# Patient Record
Sex: Female | Born: 1990 | Race: Black or African American | Hispanic: No | Marital: Single | Smoking: Never smoker
Health system: Southern US, Community
[De-identification: ages and names within clinical notes are randomized; demographics above are authoritative.]

## PROBLEM LIST (undated history)

## (undated) ENCOUNTER — Inpatient Hospital Stay (HOSPITAL_COMMUNITY): Payer: Self-pay

## (undated) ENCOUNTER — Emergency Department (HOSPITAL_BASED_OUTPATIENT_CLINIC_OR_DEPARTMENT_OTHER): Discharge status: Against Medical Advice | Payer: 59

## (undated) DIAGNOSIS — L0390 Cellulitis, unspecified: Secondary | ICD-10-CM

## (undated) DIAGNOSIS — Z8619 Personal history of other infectious and parasitic diseases: Secondary | ICD-10-CM

## (undated) DIAGNOSIS — L309 Dermatitis, unspecified: Secondary | ICD-10-CM

## (undated) DIAGNOSIS — J45909 Unspecified asthma, uncomplicated: Secondary | ICD-10-CM

## (undated) HISTORY — DX: Dermatitis, unspecified: L30.9

## (undated) HISTORY — DX: Unspecified asthma, uncomplicated: J45.909

## (undated) HISTORY — DX: Personal history of other infectious and parasitic diseases: Z86.19

## (undated) HISTORY — DX: Cellulitis, unspecified: L03.90

---
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
## 2012-08-20 ENCOUNTER — Other Ambulatory Visit: Payer: Self-pay | Admitting: Obstetrics and Gynecology

## 2012-08-20 DIAGNOSIS — N6311 Unspecified lump in the right breast, upper outer quadrant: Secondary | ICD-10-CM

## 2012-08-24 ENCOUNTER — Ambulatory Visit
Admission: RE | Admit: 2012-08-24 | Discharge: 2012-08-24 | Disposition: A | Discharge status: Home / Self Care | Payer: 59 | Source: Ambulatory Visit | Attending: Obstetrics and Gynecology | Admitting: Obstetrics and Gynecology

## 2012-08-24 DIAGNOSIS — N6311 Unspecified lump in the right breast, upper outer quadrant: Secondary | ICD-10-CM

## 2012-10-13 DIAGNOSIS — Z01419 Encounter for gynecological examination (general) (routine) without abnormal findings: Secondary | ICD-10-CM

## 2012-10-15 ENCOUNTER — Other Ambulatory Visit: Payer: Self-pay

## 2012-10-15 ENCOUNTER — Telehealth: Payer: Self-pay | Admitting: Obstetrics and Gynecology

## 2012-10-15 NOTE — Telephone Encounter (Signed)
Pt's mom cx appointment for Gardasil will call back to reschedule appointment at a later time

## 2012-11-22 ENCOUNTER — Telehealth: Payer: Self-pay | Admitting: Gynecology

## 2012-11-22 ENCOUNTER — Ambulatory Visit: Payer: Self-pay | Admitting: Obstetrics and Gynecology

## 2012-11-22 NOTE — Telephone Encounter (Signed)
Pt missed appt this afternoon and rescheduled to 11/26/12 at 3:15.  Chart in office.

## 2012-11-26 ENCOUNTER — Ambulatory Visit (INDEPENDENT_AMBULATORY_CARE_PROVIDER_SITE_OTHER): Payer: 59 | Admitting: Obstetrics and Gynecology

## 2012-11-26 ENCOUNTER — Encounter: Payer: Self-pay | Admitting: Obstetrics and Gynecology

## 2012-11-26 VITALS — BP 102/64 | HR 60 | Ht 67.0 in | Wt 135.0 lb

## 2012-11-26 DIAGNOSIS — N6320 Unspecified lump in the left breast, unspecified quadrant: Secondary | ICD-10-CM

## 2012-11-26 DIAGNOSIS — N6310 Unspecified lump in the right breast, unspecified quadrant: Secondary | ICD-10-CM

## 2012-11-26 DIAGNOSIS — N6019 Diffuse cystic mastopathy of unspecified breast: Secondary | ICD-10-CM

## 2012-11-26 DIAGNOSIS — N63 Unspecified lump in unspecified breast: Secondary | ICD-10-CM

## 2012-11-26 DIAGNOSIS — Z23 Encounter for immunization: Secondary | ICD-10-CM

## 2012-11-26 MED ORDER — ETONOGESTREL-ETHINYL ESTRADIOL 0.12-0.015 MG/24HR VA RING: VAGINAL_RING | VAGINAL | Status: DC

## 2012-11-26 NOTE — Progress Notes (Signed)
Patient ID: Felicia Klein, female   DOB: June 23, 1990, 22 y.o.   MRN: [id number]  Subjective  Here for breast recheck.  Patient does rechecks and does not feel an increase in size.    Was due for second Gardasil injection on 10/15/12.   Also wants to reinitiate contraception.  Stooped OCPS because she was forgetting to take OCPs and had irregular bleeding.  Using condoms currently.     Objective  Right breast with bilobed masses at 2 o'clock - 2.0 cm total area.  Left breat with 1 cm mass at 3 o'clock  (states has been previously identified and evaluated.)  No retractions, palpable nodes, or axillary adenopathy noted.    US Breast Right Status: Final result            Study Result    Clinical Data: RIGHT BREAST LUMP 72:20; 74-YEAR-OLD PATIENT WITH  PALPABLE LUMP FELT AT 10 O'CLOCK POSITION ON RECENT CLINICAL  PHYSICAL EXAMINATION. THE PATIENT BELIEVES THAT THE MASS HAS BEEN  UNCHANGED FOR APPROXIMATELY 7 YEARS. THE PATIENT'S MOTHER HAS A  HISTORY OF A FIBROADENOMA, EXCISED MANY YEARS AGO.  BREAST ULTRASOUND RIGHT  Comparison: None  On physical exam, there is a discretely palpable approximately 2 cm  mobile mass in the superficial aspect of the nipple.  Ultrasound is performed, showing a 2.1 x 1.7 x 0.9circumscribed  oval hypoechoic mass oriented parallel to the chest wall,  containing internal echogenic bands.  Immediately adjacent and medial to this palpable mass is a second  similar-appearing mass that is not discretely palpable, at 10:30  position 10 cm from the nipple. This second mass measures 2.6 x 0.6  x 2.2 cm, is circumscribed and oriented parallel to the chest wall.  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 The fibroadenoma at 10 o'clock position is discretely  palpable. Ultrasound follow-up at 6, 12, and 24 months is  suggested. The option of surgical excision is also discussed with  the patient and her mother.  The patient prefers ultrasound follow-  up.  Right breast ultrasound in 6 months is recommended.  BI-RADS CATEGORY 3: Probably benign finding(s) - short interval  follow-up suggested.  Original Report Authenticated By: Britta Mccreedy, M.D.       Assessment  Bilateral breast lumps.  Previous ultrasound of the right notes probably fibroadenomas.  New lump of the left breast. Need for Gardasil catch up vaccine. Need for contraception.  Plan  Bilateral breast ultrasound in September at [facility].  Appointment scheduled today. Gardasil vaccine number 2. NuvaRing Rx for 9 months.  Patient instructed in use, risks, and benefits.

## 2012-11-26 NOTE — Patient Instructions (Signed)

## 2012-11-27 DIAGNOSIS — N6019 Diffuse cystic mastopathy of unspecified breast: Secondary | ICD-10-CM | POA: Insufficient documentation

## 2013-02-25 ENCOUNTER — Ambulatory Visit
Admission: RE | Admit: 2013-02-25 | Discharge: 2013-02-25 | Disposition: A | Discharge status: Home / Self Care | Payer: 59 | Source: Ambulatory Visit | Attending: Obstetrics and Gynecology | Admitting: Obstetrics and Gynecology

## 2013-02-25 DIAGNOSIS — N6320 Unspecified lump in the left breast, unspecified quadrant: Secondary | ICD-10-CM

## 2013-02-25 DIAGNOSIS — N6310 Unspecified lump in the right breast, unspecified quadrant: Secondary | ICD-10-CM

## 2013-02-26 ENCOUNTER — Ambulatory Visit: Payer: 59

## 2013-03-11 ENCOUNTER — Ambulatory Visit: Payer: 59

## 2013-03-14 ENCOUNTER — Ambulatory Visit (INDEPENDENT_AMBULATORY_CARE_PROVIDER_SITE_OTHER): Payer: 59 | Admitting: *Deleted

## 2013-03-14 VITALS — BP 110/60 | HR 82 | Resp 20 | Wt 132.0 lb

## 2013-03-14 DIAGNOSIS — Z23 Encounter for immunization: Secondary | ICD-10-CM

## 2013-04-11 ENCOUNTER — Other Ambulatory Visit: Payer: Self-pay

## 2013-05-27 ENCOUNTER — Telehealth: Payer: Self-pay | Admitting: Obstetrics and Gynecology

## 2013-05-27 NOTE — Telephone Encounter (Signed)
Spoke with pt whose LMP was 03-21-13. Pt reports she had a + UPT at the end of November or beginning of December. Sched OV tomorrow with BS at 1 pm.

## 2013-05-27 NOTE — Telephone Encounter (Signed)
Patient would like an appointment to confirm pregnancy. Patient would like an appointment tomorrow 05/28/13.

## 2013-05-28 ENCOUNTER — Other Ambulatory Visit: Payer: Self-pay | Admitting: Obstetrics and Gynecology

## 2013-05-28 ENCOUNTER — Encounter: Payer: Self-pay | Admitting: Obstetrics and Gynecology

## 2013-05-28 ENCOUNTER — Ambulatory Visit (INDEPENDENT_AMBULATORY_CARE_PROVIDER_SITE_OTHER): Payer: 59 | Admitting: Obstetrics and Gynecology

## 2013-05-28 ENCOUNTER — Ambulatory Visit (INDEPENDENT_AMBULATORY_CARE_PROVIDER_SITE_OTHER): Payer: 59

## 2013-05-28 VITALS — BP 120/62 | HR 84 | Resp 16 | Ht 68.5 in | Wt 134.0 lb

## 2013-05-28 DIAGNOSIS — Z124 Encounter for screening for malignant neoplasm of cervix: Secondary | ICD-10-CM

## 2013-05-28 DIAGNOSIS — Z3401 Encounter for supervision of normal first pregnancy, first trimester: Secondary | ICD-10-CM

## 2013-05-28 DIAGNOSIS — Z3400 Encounter for supervision of normal first pregnancy, unspecified trimester: Secondary | ICD-10-CM

## 2013-05-28 DIAGNOSIS — Z113 Encounter for screening for infections with a predominantly sexual mode of transmission: Secondary | ICD-10-CM

## 2013-05-28 DIAGNOSIS — N912 Amenorrhea, unspecified: Secondary | ICD-10-CM

## 2013-05-28 LAB — US OB COMP LESS 14 WKS

## 2013-05-28 NOTE — Progress Notes (Signed)
Patient ID: Felicia Klein, female   DOB: [birth year], 22 y.o.   MRN: [id number] GYNECOLOGY PROBLEM VISIT  PCP:   None  Referring provider:   HPI: 22 y.o.   Single  African American  female   G1P0 with Patient's last menstrual period was 03/21/2013.   here for  Confirmation of pregnancy.  Stopped using NuvaRing and switched to condoms.  Had spotting two weeks ago. This was spontaneous and very small amount.  Breast tenderness, fatigue, a little more short of breath. No nausea. Maybe gained a few pounds. Some bloating symptoms. Some increase in vaginal discharge but no odor or itching.   Taking PNV.    GYNECOLOGIC HISTORY: Patient's last menstrual period was 03/21/2013. Sexually active:  yes Partner preference: female Contraception: condoms occ. Menopausal hormone therapy: n/a DES exposure:   no Blood transfusions:   no Sexually transmitted diseases:  Treated for Chlamydia and Gonorrhea 2011  GYN Procedures:  no Mammogram:   n/a            Pap:   never History of abnormal pap smear:  no   OB History   Grav Para Term Preterm Abortions TAB SAB Ect Mult Living   1                  Family History  Problem Relation Age of Onset  . Diabetes Father   . Diabetes Maternal Grandmother   . Cancer Maternal Grandmother     peritoneal cancer-dec age 75  . Hypertension Maternal Grandmother   . Diabetes Paternal Grandmother   . Thyroid disease Paternal Grandmother     Patient Active Problem List   Diagnosis Date Noted  . Fibrocystic breast disease 11/27/2012    Past Medical History  Diagnosis Date  . Eczema   . Cellulitis     legs    History reviewed. No pertinent past surgical history.  ALLERGIES: Review of patient's allergies indicates no known allergies.  No current outpatient prescriptions on file.   No current facility-administered medications for this visit.     ROS:  Pertinent items are noted in HPI.  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  PHYSICAL EXAMINATION:    BP 120/62  Pulse 84  Resp 16  Ht 5' 8.5" (1.74 m)  Wt 134 lb (60.782 kg)  BMI 20.08 kg/m2  LMP 03/21/2013   Wt Readings from Last 3 Encounters:  05/28/13 134 lb (60.782 kg)  03/14/13 132 lb (59.875 kg)  11/26/12 135 lb (61.236 kg)     Ht Readings from Last 3 Encounters:  05/28/13 5' 8.5" (1.74 m)  11/26/12 5\' 7"  (1.702 m)    General appearance: alert, cooperative and appears stated age Head: Normocephalic, without obvious abnormality, atraumatic Neck: no adenopathy, supple, symmetrical, trachea midline and thyroid not enlarged, symmetric, no tenderness/mass/nodules Lungs: clear to auscultation bilaterally Breasts: Inspection negative, No nipple retraction or dimpling, No nipple discharge or bleeding, No axillary or supraclavicular adenopathy, bilobed mass of the right breast at the 10 - 11 o'clock position.  1 cm and 0.5 cm, each nontender. Heart: regular rate and rhythm Abdomen: soft, non-tender; no masses,  no organomegaly Extremities: extremities normal, atraumatic, no cyanosis or edema Skin: Skin color, texture, turgor normal. No rashes or lesions Lymph nodes: Cervical, supraclavicular, and axillary nodes normal. No abnormal inguinal nodes palpated Neurologic: Grossly normal  Pelvic: External genitalia:  no lesions              Urethra:  normal appearing urethra with no masses, tenderness or lesions              Bartholins and Skenes: normal                 Vagina: normal appearing vagina with normal color and discharge, no lesions              Cervix: normal appearance              Pap and high risk HPV testing done: yes and GC/CT.            Bimanual Exam:  Uterus:  uterus is 10 week size and nontender.                                       Adnexa: normal adnexa in size, nontender and no masses                                      Rectovaginal: Confirms                                      Anus:  normal sphincter tone, no  lesions  ASSESSMENT  Early pregnancy. Right breast fibroadenomas.  Stable on physical exam.  Status post Gardasil #2 ion 03/14/13. History of GC/CT in past.   PLAN  Counseled on early prenatal care. Switch to PNV wit [facility]. Pap and GC/CT Discussed Gardasil exposure and early pregnancy using Up to Date data bank.  No adverse effect likely. Patient [facility]  She will follow through with this.  Return for ultrasound to document EGA and viability of fetus.    An After Visit Summary was printed and given to the patient.

## 2013-05-28 NOTE — Patient Instructions (Signed)
Prenatal Care  WHAT IS PRENATAL CARE?  Prenatal care means health care during your pregnancy, before your baby is born. Take care of yourself and your baby by:   Getting early prenatal care. If you know you are pregnant, or think you might be pregnant, call your caregiver as soon as possible. Schedule a visit for a general/prenatal examination.  Getting regular prenatal care. Follow your caregiver's schedule for blood and other necessary tests. Do not miss appointments.  Do everything you can to keep yourself and your baby healthy during your pregnancy.  Prenatal care should include evaluation of medical, dietary, educational, psychological, and social needs for the couple and the medical, surgical, and genetic history of the family of the mother and father.  Discuss with your caregiver:  Your medicines, prescription, over-the-counter, and herbal medicines.  Substance abuse, alcohol, smoking, and illegal drugs.  Domestic abuse and violence, if present.  Your immunizations.  Nutrition and diet.  Exercising.  Environment and occupational hazards, at home and at work.  History of sexually transmitted disease, both you and your partner.  Previous pregnancies. WHY IS PRENATAL CARE SO IMPORTANT?  By seeing you regularly, your caregiver has the chance to find problems early, so that they can be treated as soon as possible. Other problems might be prevented. Many studies have shown that early and regular prenatal care is important for the health of both mothers and their babies.  I AM THINKING ABOUT GETTING PREGNANT. HOW CAN I TAKE CARE OF MYSELF?  Taking care of yourself before you get pregnant helps you to have a healthy pregnancy. It also lowers your chances of having a baby born with a birth defect. Here are ways to take care of yourself before you get pregnant:   Eat healthy foods, exercise regularly (30 minutes per day for most days of the week is best), and get enough rest and  sleep. Talk to your caregiver about what kinds of foods and exercises are best for you.  Take 400 micrograms (mcg) of folic acid (one of the B vitamins) every day. The best way to do this is to take a daily multivitamin pill that contains this amount of folic acid. Getting enough of the synthetic (manufactured) form of folic acid every day before you get pregnant and during early pregnancy can help prevent certain birth defects. Many breakfast cereals and other grain products have folic acid added to them, but only certain cereals contain 400 mcg of folic acid per serving. Check the label on your multivitamin or cereal to find the amount of folic acid in the food.  See your caregiver for a complete check up before getting pregnant. Make sure that you have had all your immunization shots, especially for rubella (German measles). Rubella can cause serious birth defects. Chickenpox is another illness you want to avoid during pregnancy. If you have had chickenpox and rubella in the past, you should be immune to them.  Tell your caregiver about any prescription or non-prescription medicines (including herbal remedies) you are taking. Some medicines are not safe to take during pregnancy.  Stop smoking cigarettes, drinking alcohol, or taking illegal drugs. Ask your caregiver for help, if you need it. You can also get help with alcohol and drugs by talking with a member of your faith community, a counselor, or a trusted friend.  Discuss and treat any medical, social, or psychological problems before getting pregnant.  Discuss any history of genetic problems in the mother, father, and their families. Do   genetic testing before getting pregnant, when possible.  Discuss any physical or emotional abuse with your caregiver.  Discuss with your caregiver if you might be exposed to harmful chemicals on your job or where you live.  Discuss with your caregiver if you think your job or the hours you work may be  harmful and should be changed.  The father should be involved with the decision making and with all aspects of the pregnancy, labor, and delivery.  If you have medical insurance, make sure you are covered for pregnancy. I JUST FOUND OUT THAT I AM PREGNANT. HOW CAN I TAKE CARE OF MYSELF?  Here are ways to take care of yourself and the precious new life growing inside you:   Continue taking your multivitamin with 400 micrograms (mcg) of folic acid every day.  Get early and regular prenatal care. It does not matter if this is your first pregnancy or if you already have children. It is very important to see a caregiver during your pregnancy. Your caregiver will check at each visit to make sure that you and the baby are healthy. If there are any problems, action can be taken right away to help you and the baby.  Eat a healthy diet that includes:  Fruits.  Vegetables.  Foods low in saturated fat.  Grains.  Calcium-rich foods.  Drink 6 to 8 glasses of liquids a day.  Unless your caregiver tells you not to, try to be physically active for 30 minutes, most days of the week. If you are pressed for time, you can get your activity in through 10 minute segments, three times a day.  If you smoke, drink alcohol, or use drugs, STOP. These can cause long-term damage to your baby. Talk with your caregiver about steps to take to stop smoking. Talk with a member of your faith community, a counselor, a trusted friend, or your caregiver if you are concerned about your alcohol or drug use.  Ask your caregiver before taking any medicine, even over-the-counter medicines. Some medicines are not safe to take during pregnancy.  Get plenty of rest and sleep.  Avoid hot tubs and saunas during pregnancy.  Do not have X-rays taken, unless absolutely necessary and with the recommendation of your caregiver. A lead shield can be placed on your abdomen, to protect the baby when X-rays are taken in other parts of the  body.  Do not empty the cat litter when you are pregnant. It may contain a parasite that causes an infection called toxoplasmosis, which can cause birth defects. Also, use gloves when working in garden areas used by cats.  Do not eat uncooked or undercooked cheese, meats, or fish.  Stay away from toxic chemicals like:  Insecticides.  Solvents (some cleaners or paint thinners).  Lead.  Mercury.  Sexual relations may continue until the end of the pregnancy, unless you have a medical problem or there is a problem with the pregnancy and your caregiver tells you not to.  Do not wear high heel shoes, especially during the second half of the pregnancy. You can lose your balance and fall.  Do not take long trips, unless absolutely necessary. Be sure to see your caregiver before going on the trip.  Do not sit in one position for more than 2 hours, when on a trip.  Take a copy of your medical records when going on a trip.  Know where there is a hospital in the city you are visiting, in case of an   emergency.  Most dangerous household products will have pregnancy warnings on their labels. Ask your caregiver about products if you are unsure.  Limit or eliminate your caffeine intake from coffee, tea, sodas, medicines, and chocolate.  Many women continue working through pregnancy. Staying active might help you stay healthier. If you have a question about the safety or the hours you work at your particular job, talk with your caregiver.  Get informed:  Read books.  Watch videos.  Go to childbirth classes for you and the father.  Talk with experienced moms.  Ask your caregiver about childbirth education classes for you and your partner. Classes can help you and your partner prepare for the birth of your baby.  Ask about a pediatrician (baby doctor) and methods and pain medicine for labor, delivery, and possible Cesarean delivery (C-section). I AM NOT THINKING ABOUT GETTING PREGNANT  RIGHT NOW, BUT HEARD THAT ALL WOMEN SHOULD TAKE FOLIC ACID EVERY DAY?  All women of childbearing age, with even a remote chance of getting pregnant, should try to make sure they get enough folic acid. Many pregnancies are not planned. Many women do not know they are actually pregnant early in their pregnancies, and certain birth defects happen in the very early part of pregnancy. Taking 400 micrograms (mcg) of folic acid every day will help prevent certain birth defects that happen in the early part of pregnancy. If a woman begins taking vitamin pills in the second or third month of pregnancy, it may be too late to prevent birth defects. Folic acid may also have other health benefits for women, besides preventing birth defects.  HOW OFTEN SHOULD I SEE MY CAREGIVER DURING PREGNANCY?  Your caregiver will give you a schedule for your prenatal visits. You will have visits more often as you get closer to the end of your pregnancy. An average pregnancy lasts about 40 weeks.  A typical schedule includes visiting your caregiver:   About once each month, during your first 6 months of pregnancy.  Every 2 weeks, during the next 2 months.  Weekly in the last month, until the delivery date. Your caregiver will probably want to see you more often if:  You are over 35.  Your pregnancy is high risk, because you have certain health problems or problems with the pregnancy, such as:  Diabetes.  High blood pressure.  The baby is not growing on schedule, according to the dates of the pregnancy. Your caregiver will do special tests, to make sure you and the baby are not having any serious problems. WHAT HAPPENS DURING PRENATAL VISITS?   At your first prenatal visit, your caregiver will talk to you about you and your partner's health history and your family's health history, and will do a physical exam.  On your first visit, a physical exam will include checks of your blood pressure, height and weight, and an  exam of your pelvic organs. Your caregiver will do a Pap test if you have not had one recently, and will do cultures of your cervix to make sure there is no infection.  At each visit, there will be tests of your blood, urine, blood pressure, weight, and checking the progress of the baby.  Your caregiver will be able to tell you when to expect that your baby will be born.  Each visit is also a chance for you to learn about staying healthy during pregnancy and for asking questions.  Discuss whether you will be breastfeeding.  At your later prenatal   visits, your caregiver will check how you are doing and how the baby is developing. You may have a number of tests done as your pregnancy progresses.  Ultrasound exams are often used to check on the baby's growth and health.  You may have more urine and blood tests, as well as special tests, if needed. These may include amniocentesis (examine fluid in the pregnancy sac), stress tests (check how baby responds to contractions), biophysical profile (measures fetus well-being). Your caregiver will explain the tests and why they are necessary. I AM IN MY LATE THIRTIES, AND I WANT TO HAVE A CHILD NOW. SHOULD I DO ANYTHING SPECIAL?  As you get older, there is more chance of having a medical problem (high blood pressure), pregnancy problem (preeclampsia, problems with the placenta), miscarriage, or a baby born with a birth defect. However, most women in their late thirties and early forties have healthy babies. See your caregiver on a regular basis before you get pregnant and be sure to go for exams throughout your pregnancy. Your caregiver probably will want to do some special tests to check on you and your baby's health when you are pregnant.  Women today are often delaying having children until later in life, when they are in their thirties and forties. While many women in their thirties and forties have no difficulty getting pregnant, fertility does decline  with age. For women over 40 who cannot get pregnant after 6 months of trying, it is recommended that they see their caregiver for a fertility evaluation. It is not uncommon to have trouble becoming pregnant or experience infertility (inability to become pregnant after trying for one year). If you think that you or your partner may be infertile, you can discuss this with your caregiver. He or she can recommend treatments such as drugs, surgery, or assisted reproductive technology.  Document Released: 05/26/2003 Document Revised: 08/15/2011 Document Reviewed: 11/08/2012 [employer] Patient Information 2014 [employer].  

## 2013-05-28 NOTE — Progress Notes (Signed)
Ultrasound findings reviewed -  Viable IUP, EGA 9 + 5 weeks, FH 160, left CL cyst.     After visit summary to patient.  35 minutes face to face time of which over 50% was spent in counseling.

## 2013-05-29 LAB — GC/CHLAMYDIA PROBE AMP, URINE
Chlamydia, Swab/Urine, PCR: NEGATIVE
GC Probe Amp, Urine: NEGATIVE

## 2013-06-06 NOTE — L&D Delivery Note (Signed)
Operative Delivery Note At 2:58 AM a viable and healthy female was delivered via Vaginal, Spontaneous Delivery.  Presentation: vertex; Position: Left,, Occiput,, Anterior; Station: +5.  Delivery of the head: 12/25/2013  2:57 AM  , [place]  Second maneuver: , Suprapubic Pressure Posterior arm delivered  APGAR: 7, 9; weight 8 lb 2.7 oz (3705 g).   Placenta status: Intact, Spontaneous.   Cord: 3 vessels   Anesthesia: Epidural  Episiotomy: None Lacerations: 1st degree not requiring repair Suture Repair: NA Est. Blood Loss (mL): 300  Mom to postpartum.  Baby to Couplet care / Skin to Skin.  Felicia Klein H. 12/25/2013, 3:19 AM

## 2013-06-28 ENCOUNTER — Other Ambulatory Visit: Payer: Self-pay

## 2013-06-28 LAB — OB RESULTS CONSOLE ABO/RH: RH TYPE: POSITIVE

## 2013-06-28 LAB — OB RESULTS CONSOLE ANTIBODY SCREEN: Antibody Screen: NEGATIVE

## 2013-06-28 LAB — OB RESULTS CONSOLE HIV ANTIBODY (ROUTINE TESTING): HIV: NONREACTIVE

## 2013-06-28 LAB — OB RESULTS CONSOLE GC/CHLAMYDIA
Chlamydia: NEGATIVE
Gonorrhea: NEGATIVE

## 2013-06-28 LAB — OB RESULTS CONSOLE HEPATITIS B SURFACE ANTIGEN: Hepatitis B Surface Ag: NEGATIVE

## 2013-06-28 LAB — OB RESULTS CONSOLE RUBELLA ANTIBODY, IGM: Rubella: IMMUNE

## 2013-06-28 LAB — OB RESULTS CONSOLE RPR: RPR: NONREACTIVE

## 2013-12-05 LAB — OB RESULTS CONSOLE GBS: GBS: NEGATIVE

## 2013-12-12 ENCOUNTER — Inpatient Hospital Stay (HOSPITAL_COMMUNITY)
Admission: AD | Admit: 2013-12-12 | Discharge: 2013-12-12 | Disposition: A | Discharge status: Home / Self Care | Payer: 59 | Source: Ambulatory Visit | Attending: Obstetrics and Gynecology | Admitting: Obstetrics and Gynecology

## 2013-12-12 ENCOUNTER — Encounter (HOSPITAL_COMMUNITY): Payer: Self-pay | Admitting: *Deleted

## 2013-12-12 DIAGNOSIS — Z833 Family history of diabetes mellitus: Secondary | ICD-10-CM | POA: Insufficient documentation

## 2013-12-12 DIAGNOSIS — O99891 Other specified diseases and conditions complicating pregnancy: Secondary | ICD-10-CM | POA: Diagnosis not present

## 2013-12-12 DIAGNOSIS — R030 Elevated blood-pressure reading, without diagnosis of hypertension: Secondary | ICD-10-CM | POA: Insufficient documentation

## 2013-12-12 DIAGNOSIS — O26839 Pregnancy related renal disease, unspecified trimester: Secondary | ICD-10-CM | POA: Insufficient documentation

## 2013-12-12 DIAGNOSIS — O9989 Other specified diseases and conditions complicating pregnancy, childbirth and the puerperium: Principal | ICD-10-CM

## 2013-12-12 DIAGNOSIS — O1213 Gestational proteinuria, third trimester: Secondary | ICD-10-CM

## 2013-12-12 LAB — CBC
HCT: 32.9 % — ABNORMAL LOW (ref 36.0–46.0)
Hemoglobin: 11.1 g/dL — ABNORMAL LOW (ref 12.0–15.0)
MCH: 30.5 pg (ref 26.0–34.0)
MCHC: 33.7 g/dL (ref 30.0–36.0)
MCV: 90.4 fL (ref 78.0–100.0)
PLATELETS: 156 10*3/uL (ref 150–400)
RBC: 3.64 MIL/uL — ABNORMAL LOW (ref 3.87–5.11)
RDW: 13.4 % (ref 11.5–15.5)
WBC: 11.3 10*3/uL — ABNORMAL HIGH (ref 4.0–10.5)

## 2013-12-12 LAB — COMPREHENSIVE METABOLIC PANEL
ALBUMIN: 3.1 g/dL — AB (ref 3.5–5.2)
ALT: 9 U/L (ref 0–35)
AST: 20 U/L (ref 0–37)
Alkaline Phosphatase: 114 U/L (ref 39–117)
Anion gap: 11 (ref 5–15)
BUN: 8 mg/dL (ref 6–23)
CALCIUM: 9 mg/dL (ref 8.4–10.5)
CO2: 24 meq/L (ref 19–32)
CREATININE: 0.71 mg/dL (ref 0.50–1.10)
Chloride: 102 mEq/L (ref 96–112)
GFR calc Af Amer: >90 mL/min (ref >90)
Glucose, Bld: 71 mg/dL (ref 70–99)
Potassium: 4 mEq/L (ref 3.7–5.3)
Sodium: 137 mEq/L (ref 137–147)
TOTAL PROTEIN: 6.7 g/dL (ref 6.0–8.3)
Total Bilirubin: 0.3 mg/dL (ref 0.3–1.2)

## 2013-12-12 LAB — URINALYSIS, ROUTINE W REFLEX MICROSCOPIC
Bilirubin Urine: NEGATIVE
GLUCOSE, UA: NEGATIVE mg/dL
KETONES UR: NEGATIVE mg/dL
Nitrite: NEGATIVE
PROTEIN: 100 mg/dL — AB
Specific Gravity, Urine: 1.02 (ref 1.005–1.030)
Urobilinogen, UA: 0.2 mg/dL (ref 0.0–1.0)
pH: 6.5 (ref 5.0–8.0)

## 2013-12-12 LAB — URINE MICROSCOPIC-ADD ON

## 2013-12-12 LAB — PROTEIN / CREATININE RATIO, URINE
CREATININE, URINE: 178.16 mg/dL
Protein Creatinine Ratio: 0.47 — ABNORMAL HIGH (ref 0.00–0.15)
TOTAL PROTEIN, URINE: 83.3 mg/dL

## 2013-12-12 LAB — LACTATE DEHYDROGENASE: LDH: 283 U/L — AB (ref 94–250)

## 2013-12-12 LAB — URIC ACID: Uric Acid, Serum: 5 mg/dL (ref 2.4–7.0)

## 2013-12-12 NOTE — MAU Note (Signed)
Urine sent to lab 

## 2013-12-12 NOTE — MAU Provider Note (Signed)
History     CSN: [id number]  Arrival date and time: 12/12/13 1456   First Provider Initiated Contact with Patient 12/12/13 1527      Chief Complaint  Patient presents with  . Hypertension   HPI  Pt is [redacted] weeks pregnant G1P0 seen in the office today with elevated BP and 1+proteinuria. Pt is here for [facility] evaluation and NST. Pt denies LOF or bleeding.  Pt's cervix was 2 cm in the office. Pt denies N/V, constipation or diarrhea, UTI sx. Pt denies abd pain or epigastric pain. Pt has some edema in feet when gets up in the mornings.  Pt denies c/o  Past Medical History  Diagnosis Date  . Eczema   . Cellulitis     legs    History reviewed. No pertinent past surgical history.  Family History  Problem Relation Age of Onset  . Diabetes Father   . Diabetes Maternal Grandmother   . Cancer Maternal Grandmother     peritoneal cancer-dec age 110  . Hypertension Maternal Grandmother   . Diabetes Paternal Grandmother   . Thyroid disease Paternal Grandmother     History  Substance Use Topics  . Smoking status: Never Smoker   . Smokeless tobacco: Never Used  . Alcohol Use: No    Allergies: No Known Allergies  Prescriptions prior to admission  Medication Sig Dispense Refill  . metroNIDAZOLE (FLAGYL) 500 MG tablet Take 500 mg by mouth 2 (two) times daily.      . Prenatal Vit-Fe Fumarate-FA (PRENATAL MULTIVITAMIN) TABS tablet Take 1 tablet by mouth daily at 12 noon.        Review of Systems  Constitutional: Negative for fever and chills.  Gastrointestinal: Negative for nausea, vomiting, abdominal pain, diarrhea and constipation.  Genitourinary: Negative for dysuria and urgency.  Neurological: Negative for headaches.   Physical Exam   There were no vitals taken for this visit.  Physical Exam  Nursing note and vitals reviewed. Constitutional: She is oriented to person, place, and time. She appears well-developed and well-nourished. No distress.  HENT:  Head: Normocephalic.   Eyes: Pupils are equal, round, and reactive to light.  Neck: Normal range of motion. Neck supple.  Respiratory: Effort normal.  GI: Soft. She exhibits no distension. There is no tenderness. There is no rebound and no guarding.  Irregular ctx; FHR/NST reactive, no decelerations  Musculoskeletal: Normal range of motion.  Neurological: She is alert and oriented to person, place, and time.  Skin: Skin is warm and dry.  Psychiatric: She has a normal mood and affect.    MAU Course  Procedures Results for orders placed during the hospital encounter of 12/12/13 (from the past 24 hour(s))  PROTEIN / CREATININE RATIO, URINE     Status: Abnormal   Collection Time    12/12/13  3:37 PM      Result Value Ref Range   Creatinine, Urine 178.16     Total Protein, Urine 83.3     PROTEIN CREATININE RATIO 0.47 (*) 0.00 - 0.15  URINALYSIS, ROUTINE W REFLEX MICROSCOPIC     Status: Abnormal   Collection Time    12/12/13  3:37 PM      Result Value Ref Range   Color, Urine YELLOW  YELLOW   APPearance CLEAR  CLEAR   Specific Gravity, Urine 1.020  1.005 - 1.030   pH 6.5  5.0 - 8.0   Glucose, UA NEGATIVE  NEGATIVE mg/dL   Hgb urine dipstick SMALL (*) NEGATIVE   Bilirubin  Urine NEGATIVE  NEGATIVE   Ketones, ur NEGATIVE  NEGATIVE mg/dL   Protein, ur 161100 (*) NEGATIVE mg/dL   Urobilinogen, UA 0.2  0.0 - 1.0 mg/dL   Nitrite NEGATIVE  NEGATIVE   Leukocytes, UA SMALL (*) NEGATIVE  URINE MICROSCOPIC-ADD ON     Status: Abnormal   Collection Time    12/12/13  3:37 PM      Result Value Ref Range   Squamous Epithelial / LPF MANY (*) RARE   WBC, UA 11-20  <3 WBC/hpf   RBC / HPF 0-2  <3 RBC/hpf   Bacteria, UA RARE  RARE   Urine-Other MUCOUS PRESENT    CBC     Status: Abnormal   Collection Time    12/12/13  3:45 PM      Result Value Ref Range   WBC 11.3 (*) 4.0 - 10.5 K/uL   RBC 3.64 (*) 3.87 - 5.11 MIL/uL   Hemoglobin 11.1 (*) 12.0 - 15.0 g/dL   HCT 09.632.9 (*) 04.536.0 - 40.946.0 %   MCV 90.4  78.0 - 100.0 fL    MCH 30.5  26.0 - 34.0 pg   MCHC 33.7  30.0 - 36.0 g/dL   RDW 81.113.4  91.411.5 - 78.215.5 %   Platelets 156  150 - 400 K/uL  COMPREHENSIVE METABOLIC PANEL     Status: Abnormal   Collection Time    12/12/13  3:45 PM      Result Value Ref Range   Sodium 137  137 - 147 mEq/L   Potassium 4.0  3.7 - 5.3 mEq/L   Chloride 102  96 - 112 mEq/L   CO2 24  19 - 32 mEq/L   Glucose, Bld 71  70 - 99 mg/dL   BUN 8  6 - 23 mg/dL   Creatinine, Ser 9.560.71  0.50 - 1.10 mg/dL   Calcium 9.0  8.4 - 21.310.5 mg/dL   Total Protein 6.7  6.0 - 8.3 g/dL   Albumin 3.1 (*) 3.5 - 5.2 g/dL   AST 20  0 - 37 U/L   ALT 9  0 - 35 U/L   Alkaline Phosphatase 114  39 - 117 U/L   Total Bilirubin 0.3  0.3 - 1.2 mg/dL   GFR calc non Af Amer >90  >90 mL/min   GFR calc Af Amer >90  >90 mL/min   Anion gap 11  5 - 15  LACTATE DEHYDROGENASE     Status: Abnormal   Collection Time    12/12/13  3:45 PM      Result Value Ref Range   LDH 283 (*) 94 - 250 U/L  URIC ACID     Status: None   Collection Time    12/12/13  3:45 PM      Result Value Ref Range   Uric Acid, Serum 5.0  2.4 - 7.0 mg/dL  NST- reactive Urine culture pending Discussed with Dr. Dareen PianoAnderson; will send pt home with 24 hr urine collection for protein and creatinine  Assessment and Plan  Proteinuria- 24 hour urine for protein and creatinine Pt to return tomorrow to MAU to return collected urine and BP check  Brewer Hitchman 12/12/2013, 3:30 PM

## 2013-12-12 NOTE — MAU Note (Signed)
Pt was sent over from the office for further evaluation of her bp

## 2013-12-13 ENCOUNTER — Encounter (HOSPITAL_COMMUNITY): Payer: Self-pay | Admitting: *Deleted

## 2013-12-13 ENCOUNTER — Inpatient Hospital Stay (HOSPITAL_COMMUNITY)
Admission: AD | Admit: 2013-12-13 | Discharge: 2013-12-13 | Disposition: A | Discharge status: Home / Self Care | Payer: 59 | Source: Ambulatory Visit | Attending: Obstetrics & Gynecology | Admitting: Obstetrics & Gynecology

## 2013-12-13 DIAGNOSIS — O133 Gestational [pregnancy-induced] hypertension without significant proteinuria, third trimester: Secondary | ICD-10-CM

## 2013-12-13 DIAGNOSIS — O99891 Other specified diseases and conditions complicating pregnancy: Secondary | ICD-10-CM | POA: Diagnosis present

## 2013-12-13 DIAGNOSIS — Z833 Family history of diabetes mellitus: Secondary | ICD-10-CM | POA: Insufficient documentation

## 2013-12-13 DIAGNOSIS — O139 Gestational [pregnancy-induced] hypertension without significant proteinuria, unspecified trimester: Secondary | ICD-10-CM | POA: Insufficient documentation

## 2013-12-13 LAB — CREATININE CLEARANCE, URINE, 24 HOUR
CREATININE 24H UR: 1401 mg/d (ref 700–1800)
CREATININE: 0.71 mg/dL (ref 0.50–1.10)
Collection Interval-CRCL: 24 hours
Creatinine Clearance: 137 mL/min — ABNORMAL HIGH (ref 75–115)
Creatinine, Urine: 100.1 mg/dL
Urine Total Volume-CRCL: 1400 mL

## 2013-12-13 LAB — CULTURE, OB URINE
COLONY COUNT: NO GROWTH
CULTURE: NO GROWTH
Special Requests: NORMAL

## 2013-12-13 NOTE — Discharge Instructions (Signed)

## 2013-12-13 NOTE — MAU Note (Addendum)
PT  SAYS SHE  HAD PROTEIN IN UA IN OFFICE  YESTERDAY-  SENT HOME FOR 24 HR COLLECTION  .  BROUGHT THAT BACK TODAY.    IS HERE FOR BP RECHECK-  FROM OFFICE.  WAS HERE YESTERDAY - BP OK .  DENIES   H/A , BLURRED VISION,  EPIGASTRIC PAIN,  NO SWELLING.     DENIES HSV AN MRSA     GBS- UNSURE.    VE IN OFFICE   2 CM

## 2013-12-13 NOTE — MAU Provider Note (Signed)
History     CSN: [id number]  Arrival date and time: 12/13/13 1900   First Provider Initiated Contact with Patient 12/13/13 1924      No chief complaint on file.  HPI Ms. Felicia Klein is a 23 y.o. G1P0 at [redacted]w[redacted]d who presents to MAU today to drop off a 24 hour urine collection and get BP checked. The patient was seen in MAU yesterday for evaluation of elevated BP. She denies headache, blurred vision, RUQ pain or peripheral edema today. She states good fetal movement and endorses occasional, mild, irregular contractions. She denies vaginal bleeding, abnormal discharge or LOF.   OB History   Grav Para Term Preterm Abortions TAB SAB Ect Mult Living   1               Past Medical History  Diagnosis Date  . Eczema   . Cellulitis     legs    History reviewed. No pertinent past surgical history.  Family History  Problem Relation Age of Onset  . Diabetes Father   . Diabetes Maternal Grandmother   . Cancer Maternal Grandmother     peritoneal cancer-dec age 17  . Hypertension Maternal Grandmother   . Diabetes Paternal Grandmother   . Thyroid disease Paternal Grandmother     History  Substance Use Topics  . Smoking status: Never Smoker   . Smokeless tobacco: Never Used  . Alcohol Use: No    Allergies: No Known Allergies  Prescriptions prior to admission  Medication Sig Dispense Refill  . metroNIDAZOLE (FLAGYL) 500 MG tablet Take 500 mg by mouth 2 (two) times daily.      . Prenatal Vit-Fe Fumarate-FA (PRENATAL MULTIVITAMIN) TABS tablet Take 1 tablet by mouth daily at 12 noon.        Review of Systems  Eyes: Negative for blurred vision.  Cardiovascular: Negative for leg swelling.  Gastrointestinal: Negative for abdominal pain.  Genitourinary:       Neg - vaginal bleeding, discharge, LOF  Neurological: Negative for headaches.   Physical Exam   Blood pressure 121/65, pulse 72, temperature 98.1 F (36.7 C), temperature source Oral, last menstrual period  03/21/2013.  Physical Exam  Constitutional: She is oriented to person, place, and time. She appears well-developed and well-nourished. No distress.  HENT:  Head: Normocephalic and atraumatic.  Cardiovascular: Normal rate and intact distal pulses.   Respiratory: Effort normal.  GI: Soft. She exhibits no distension and no mass. There is no tenderness. There is no rebound and no guarding.  Musculoskeletal: She exhibits no edema.  Neurological: She is alert and oriented to person, place, and time.  Skin: Skin is warm and dry. No erythema.  Psychiatric: She has a normal mood and affect.    Fetal Monitoring: Baseline: 120 bpm, moderate variability, + accelerations, no decelerations Contractions: moderate UI  MAU Course  Procedures None  MDM Called Dr. Mora Appl. She was in a delivery and will return call to MAU when finished.  Dr. Mora Appl returned call to MAU. Labs and patient status were discussed.  Patient may be discharged with pre-eclampsia precautions with the understanding that abnormal labs results may indicate need for her to return for induction  Assessment and Plan  A: Transient HTN of pregnancy Proteinuria  P: Discharge home Pre-eclampsia warning signs discussed Patient advised to follow-up in the office as scheduled unless otherwise contacted by [facility] Patient may return to MAU as needed or if her condition were to change or worsen  Freddi Starr,  PA-C  12/13/2013 8:07 PM

## 2013-12-14 LAB — PROTEIN, URINE, 24 HOUR
COLLECTION INTERVAL-UPROT: 24 h
Protein, 24H Urine: 406 mg/d — ABNORMAL HIGH (ref 50–100)
Protein, Urine: 29 mg/dL
URINE TOTAL VOLUME-UPROT: 1400 mL

## 2013-12-14 NOTE — MAU Provider Note (Signed)
Reviewed case with PA agree with above  Felicia Klein [name]  

## 2013-12-23 ENCOUNTER — Telehealth (HOSPITAL_COMMUNITY): Payer: Self-pay | Admitting: *Deleted

## 2013-12-23 ENCOUNTER — Encounter (HOSPITAL_COMMUNITY): Payer: Self-pay | Admitting: *Deleted

## 2013-12-23 NOTE — Telephone Encounter (Signed)
Preadmission screen  

## 2013-12-24 ENCOUNTER — Inpatient Hospital Stay (HOSPITAL_COMMUNITY)
Admission: RE | Admit: 2013-12-24 | Discharge: 2013-12-26 | DRG: 774 | Disposition: A | Discharge status: Home / Self Care | Payer: Medicaid Other | Source: Ambulatory Visit | Attending: Obstetrics and Gynecology | Admitting: Obstetrics and Gynecology

## 2013-12-24 ENCOUNTER — Inpatient Hospital Stay (HOSPITAL_COMMUNITY): Payer: Medicaid Other | Admitting: Anesthesiology

## 2013-12-24 ENCOUNTER — Encounter (HOSPITAL_COMMUNITY): Payer: Medicaid Other | Admitting: Anesthesiology

## 2013-12-24 DIAGNOSIS — Z8249 Family history of ischemic heart disease and other diseases of the circulatory system: Secondary | ICD-10-CM | POA: Diagnosis not present

## 2013-12-24 DIAGNOSIS — I10 Essential (primary) hypertension: Secondary | ICD-10-CM | POA: Diagnosis present

## 2013-12-24 DIAGNOSIS — Z833 Family history of diabetes mellitus: Secondary | ICD-10-CM | POA: Diagnosis not present

## 2013-12-24 DIAGNOSIS — IMO0002 Reserved for concepts with insufficient information to code with codable children: Principal | ICD-10-CM | POA: Diagnosis present

## 2013-12-24 DIAGNOSIS — O26839 Pregnancy related renal disease, unspecified trimester: Secondary | ICD-10-CM | POA: Diagnosis present

## 2013-12-24 LAB — CBC
HCT: 34.8 % — ABNORMAL LOW (ref 36.0–46.0)
Hemoglobin: 11.7 g/dL — ABNORMAL LOW (ref 12.0–15.0)
MCH: 30.6 pg (ref 26.0–34.0)
MCHC: 33.6 g/dL (ref 30.0–36.0)
MCV: 91.1 fL (ref 78.0–100.0)
Platelets: 191 10*3/uL (ref 150–400)
RBC: 3.82 MIL/uL — ABNORMAL LOW (ref 3.87–5.11)
RDW: 13.7 % (ref 11.5–15.5)
WBC: 11.6 10*3/uL — ABNORMAL HIGH (ref 4.0–10.5)

## 2013-12-24 LAB — COMPREHENSIVE METABOLIC PANEL
ALT: 8 U/L (ref 0–35)
ANION GAP: 12 (ref 5–15)
AST: 19 U/L (ref 0–37)
Albumin: 3.1 g/dL — ABNORMAL LOW (ref 3.5–5.2)
Alkaline Phosphatase: 121 U/L — ABNORMAL HIGH (ref 39–117)
BUN: 12 mg/dL (ref 6–23)
CALCIUM: 9.5 mg/dL (ref 8.4–10.5)
CO2: 25 mEq/L (ref 19–32)
Chloride: 99 mEq/L (ref 96–112)
Creatinine, Ser: 0.72 mg/dL (ref 0.50–1.10)
GLUCOSE: 98 mg/dL (ref 70–99)
Potassium: 4.1 mEq/L (ref 3.7–5.3)
Sodium: 136 mEq/L — ABNORMAL LOW (ref 137–147)
TOTAL PROTEIN: 6.9 g/dL (ref 6.0–8.3)
Total Bilirubin: 0.3 mg/dL (ref 0.3–1.2)

## 2013-12-24 LAB — RPR

## 2013-12-24 LAB — URIC ACID: URIC ACID, SERUM: 5.4 mg/dL (ref 2.4–7.0)

## 2013-12-24 LAB — LACTATE DEHYDROGENASE: LDH: 179 U/L (ref 94–250)

## 2013-12-24 LAB — SAMPLE TO BLOOD BANK

## 2013-12-24 MED ORDER — LIDOCAINE HCL (PF) 1 % IJ SOLN
30.0000 mL | INTRAMUSCULAR | Status: DC | PRN
  Filled 2013-12-24: qty 30

## 2013-12-24 MED ORDER — ACETAMINOPHEN 325 MG PO TABS: 650.0000 mg | ORAL_TABLET | ORAL | Status: DC | PRN

## 2013-12-24 MED ORDER — DIPHENHYDRAMINE HCL 50 MG/ML IJ SOLN: 12.5000 mg | INTRAMUSCULAR | Status: DC | PRN

## 2013-12-24 MED ORDER — EPHEDRINE 5 MG/ML INJ
10.0000 mg | INTRAVENOUS | Status: DC | PRN
  Filled 2013-12-24: qty 2

## 2013-12-24 MED ORDER — IBUPROFEN 600 MG PO TABS: 600.0000 mg | ORAL_TABLET | Freq: Four times a day (QID) | ORAL | Status: DC | PRN

## 2013-12-24 MED ORDER — LIDOCAINE HCL (PF) 1 % IJ SOLN
INTRAMUSCULAR | Status: DC | PRN
  Administered 2013-12-24 (×2): 5 mL

## 2013-12-24 MED ORDER — OXYTOCIN 40 UNITS IN LACTATED RINGERS INFUSION - SIMPLE MED: 1.0000 m[IU]/min | INTRAVENOUS | Status: DC

## 2013-12-24 MED ORDER — OXYCODONE-ACETAMINOPHEN 5-325 MG PO TABS: 1.0000 | ORAL_TABLET | ORAL | Status: DC | PRN

## 2013-12-24 MED ORDER — PHENYLEPHRINE 40 MCG/ML (10ML) SYRINGE FOR IV PUSH (FOR BLOOD PRESSURE SUPPORT)
80.0000 ug | PREFILLED_SYRINGE | INTRAVENOUS | Status: DC | PRN
  Filled 2013-12-24: qty 2
  Filled 2013-12-24: qty 10

## 2013-12-24 MED ORDER — CITRIC ACID-SODIUM CITRATE 334-500 MG/5ML PO SOLN: 30.0000 mL | ORAL | Status: DC | PRN

## 2013-12-24 MED ORDER — LACTATED RINGERS IV SOLN
INTRAVENOUS | Status: DC
  Administered 2013-12-24 (×3): via INTRAVENOUS

## 2013-12-24 MED ORDER — PHENYLEPHRINE 40 MCG/ML (10ML) SYRINGE FOR IV PUSH (FOR BLOOD PRESSURE SUPPORT)
80.0000 ug | PREFILLED_SYRINGE | INTRAVENOUS | Status: DC | PRN
  Filled 2013-12-24: qty 2

## 2013-12-24 MED ORDER — FLEET ENEMA 7-19 GM/118ML RE ENEM: 1.0000 | ENEMA | RECTAL | Status: DC | PRN

## 2013-12-24 MED ORDER — ONDANSETRON HCL 4 MG/2ML IJ SOLN: 4.0000 mg | Freq: Four times a day (QID) | INTRAMUSCULAR | Status: DC | PRN

## 2013-12-24 MED ORDER — LACTATED RINGERS IV SOLN: 500.0000 mL | INTRAVENOUS | Status: DC | PRN

## 2013-12-24 MED ORDER — OXYTOCIN BOLUS FROM INFUSION: 500.0000 mL | INTRAVENOUS | Status: DC

## 2013-12-24 MED ORDER — OXYTOCIN 40 UNITS IN LACTATED RINGERS INFUSION - SIMPLE MED
1.0000 m[IU]/min | INTRAVENOUS | Status: DC
  Administered 2013-12-24: 2 m[IU]/min via INTRAVENOUS

## 2013-12-24 MED ORDER — LACTATED RINGERS IV SOLN: 500.0000 mL | Freq: Once | INTRAVENOUS | Status: DC

## 2013-12-24 MED ORDER — FENTANYL 2.5 MCG/ML BUPIVACAINE 1/10 % EPIDURAL INFUSION (WH - ANES)
14.0000 mL/h | INTRAMUSCULAR | Status: DC | PRN
  Administered 2013-12-24 (×2): 14 mL/h via EPIDURAL
  Filled 2013-12-24 (×2): qty 125

## 2013-12-24 MED ORDER — OXYTOCIN 40 UNITS IN LACTATED RINGERS INFUSION - SIMPLE MED
62.5000 mL/h | INTRAVENOUS | Status: DC
  Filled 2013-12-24: qty 1000

## 2013-12-24 MED ORDER — TERBUTALINE SULFATE 1 MG/ML IJ SOLN: 0.2500 mg | Freq: Once | INTRAMUSCULAR | Status: AC | PRN

## 2013-12-24 NOTE — Anesthesia Procedure Notes (Signed)
Epidural Patient location during procedure: OB Start time: 12/24/2013 12:01 PM  Staffing Anesthesiologist: Brayton CavesJACKSON, Kindal Ponti Performed by: anesthesiologist   Preanesthetic Checklist Completed: patient identified, site marked, surgical consent, pre-op evaluation, timeout performed, IV checked, risks and benefits discussed and monitors and equipment checked  Epidural Patient position: sitting Prep: site prepped and draped and DuraPrep Patient monitoring: continuous pulse ox and blood pressure Approach: midline Location: L3-L4 Injection technique: LOR air  Needle:  Needle type: Tuohy  Needle gauge: 17 G Needle length: 9 cm and 9 Needle insertion depth: 6 cm Catheter type: closed end flexible Catheter size: 19 Gauge Catheter at skin depth: 11 cm Test dose: negative  Assessment Events: blood not aspirated, injection not painful, no injection resistance, negative IV test and no paresthesia  Additional Notes Patient identified.  Risk benefits discussed including failed block, incomplete pain control, headache, nerve damage, paralysis, blood pressure changes, nausea, vomiting, reactions to medication both toxic or allergic, and postpartum back pain.  Patient expressed understanding and wished to proceed.  All questions were answered.  Sterile technique used throughout procedure and epidural site dressed with sterile barrier dressing. No paresthesia or other complications noted.The patient did not experience any signs of intravascular injection such as tinnitus or metallic taste in mouth nor signs of intrathecal spread such as rapid motor block. Please see nursing notes for vital signs.

## 2013-12-24 NOTE — H&P (Signed)
Felicia Klein is a 23 y.o. female presenting for IOL for proteinuria  23 yo G1P0 @ 39+5 presents for IOL for proteinuria. Pt was evaluated for elevated bps 2 weeks ago. Labs WNL but 24 hr urine returned 400 mg. Otherwise pregnancy has been uncomplicated History OB History   Grav Para Term Preterm Abortions TAB SAB Ect Mult Living   1              Past Medical History  Diagnosis Date  . Eczema   . Cellulitis     legs  . Hx of varicella   . Asthma     childhood   No past surgical history on file. Family History: family history includes Cancer in her maternal grandmother; Diabetes in her father, maternal grandmother, and paternal grandmother; Hypertension in her maternal grandmother; Thyroid disease in her paternal grandmother. Social History:  reports that she has never smoked. She has never used smokeless tobacco. She reports that she does not drink alcohol or use illicit drugs.   Prenatal Transfer Tool  Maternal Diabetes: No Genetic Screening: Normal Maternal Ultrasounds/Referrals: Normal Fetal Ultrasounds or other Referrals:  None Maternal Substance Abuse:  No Significant Maternal Medications:  None Significant Maternal Lab Results:  None Other Comments:  None  ROS: as above  Dilation: 3.5 Effacement (%): 60 Station: -2 Exam by:: Aemilia Dedrick Blood pressure 128/70, pulse 78, temperature 98.2 F (36.8 C), temperature source Oral, resp. rate 20, height 5\' 9"  (1.753 m), weight 79.833 kg (176 lb), last menstrual period 03/21/2013. Exam Physical Exam   CBC    Component Value Date/Time   WBC 11.6* 12/24/2013 0800   RBC 3.82* 12/24/2013 0800   HGB 11.7* 12/24/2013 0800   HCT 34.8* 12/24/2013 0800   PLT 191 12/24/2013 0800   MCV 91.1 12/24/2013 0800   MCH 30.6 12/24/2013 0800   MCHC 33.6 12/24/2013 0800   RDW 13.7 12/24/2013 0800    CMP     Component Value Date/Time   NA 136* 12/24/2013 0800   K 4.1 12/24/2013 0800   CL 99 12/24/2013 0800   CO2 25 12/24/2013 0800   GLUCOSE 98  12/24/2013 0800   BUN 12 12/24/2013 0800   CREATININE 0.72 12/24/2013 0800   CREATININE 0.71 12/12/2013 1900   CALCIUM 9.5 12/24/2013 0800   PROT 6.9 12/24/2013 0800   ALBUMIN 3.1* 12/24/2013 0800   AST 19 12/24/2013 0800   ALT 8 12/24/2013 0800   ALKPHOS 121* 12/24/2013 0800   BILITOT 0.3 12/24/2013 0800   GFRNONAA >90 12/24/2013 0800   GFRAA >90 12/24/2013 0800     Prenatal labs: ABO, Rh: A/Positive/-- (01/23 0000) Antibody: Negative (01/23 0000) Rubella: Immune (01/23 0000) RPR: Nonreactive (01/23 0000)  HBsAg: Negative (01/23 0000)  HIV: Non-reactive (01/23 0000)  GBS: Negative (07/02 0000)   Assessment/Plan: 1) Admit  2) epidural on request 3) CMP/CBC 4) AROM clear fluid  Felicia Klein H. 12/24/2013, 9:52 AM

## 2013-12-24 NOTE — Anesthesia Preprocedure Evaluation (Addendum)
Anesthesia Evaluation  Patient identified by MRN, date of birth, ID band Patient awake    Reviewed: Allergy & Precautions, H&P , Patient's Chart, lab work & pertinent test results, reviewed documented beta blocker date and time   History of Anesthesia Complications Negative for: history of anesthetic complications  Airway Mallampati: II TM Distance: >3 FB Neck ROM: full    Dental   Pulmonary asthma ,  breath sounds clear to auscultation        Cardiovascular Exercise Tolerance: Good hypertension, Rhythm:regular Rate:Normal     Neuro/Psych negative psych ROS   GI/Hepatic   Endo/Other    Renal/GU      Musculoskeletal   Abdominal   Peds  Hematology   Anesthesia Other Findings Pre-eclampsia  Reproductive/Obstetrics (+) Pregnancy                         Anesthesia Physical Anesthesia Plan  ASA: III  Anesthesia Plan: Epidural   Post-op Pain Management:    Induction:   Airway Management Planned:   Additional Equipment:   Intra-op Plan:   Post-operative Plan:   Informed Consent: I have reviewed the patients History and Physical, chart, labs and discussed the procedure including the risks, benefits and alternatives for the proposed anesthesia with the patient or authorized representative who has indicated his/her understanding and acceptance.   Dental Advisory Given  Plan Discussed with: CRNA, Surgeon and Anesthesiologist  Anesthesia Plan Comments:        Anesthesia Quick Evaluation

## 2013-12-25 ENCOUNTER — Encounter (HOSPITAL_COMMUNITY): Payer: Self-pay

## 2013-12-25 LAB — CBC
HCT: 34.8 % — ABNORMAL LOW (ref 36.0–46.0)
Hemoglobin: 12.1 g/dL (ref 12.0–15.0)
MCH: 31.4 pg (ref 26.0–34.0)
MCHC: 34.8 g/dL (ref 30.0–36.0)
MCV: 90.4 fL (ref 78.0–100.0)
Platelets: 151 10*3/uL (ref 150–400)
RBC: 3.85 MIL/uL — ABNORMAL LOW (ref 3.87–5.11)
RDW: 13.6 % (ref 11.5–15.5)
WBC: 14 10*3/uL — ABNORMAL HIGH (ref 4.0–10.5)

## 2013-12-25 MED ORDER — ZOLPIDEM TARTRATE 5 MG PO TABS: 5.0000 mg | ORAL_TABLET | Freq: Every evening | ORAL | Status: DC | PRN

## 2013-12-25 MED ORDER — WITCH HAZEL-GLYCERIN EX PADS
1.0000 "application " | MEDICATED_PAD | CUTANEOUS | Status: DC | PRN
  Administered 2013-12-25: 1 via TOPICAL

## 2013-12-25 MED ORDER — PRENATAL MULTIVITAMIN CH
1.0000 | ORAL_TABLET | Freq: Every day | ORAL | Status: DC
  Administered 2013-12-25 – 2013-12-26 (×2): 1 via ORAL
  Filled 2013-12-25 (×2): qty 1

## 2013-12-25 MED ORDER — DIPHENHYDRAMINE HCL 25 MG PO CAPS: 25.0000 mg | ORAL_CAPSULE | Freq: Four times a day (QID) | ORAL | Status: DC | PRN

## 2013-12-25 MED ORDER — SENNOSIDES-DOCUSATE SODIUM 8.6-50 MG PO TABS
2.0000 | ORAL_TABLET | ORAL | Status: DC
  Administered 2013-12-25: 2 via ORAL
  Filled 2013-12-25: qty 2

## 2013-12-25 MED ORDER — SIMETHICONE 80 MG PO CHEW: 80.0000 mg | CHEWABLE_TABLET | ORAL | Status: DC | PRN

## 2013-12-25 MED ORDER — LANOLIN HYDROUS EX OINT: TOPICAL_OINTMENT | CUTANEOUS | Status: DC | PRN

## 2013-12-25 MED ORDER — IBUPROFEN 600 MG PO TABS
600.0000 mg | ORAL_TABLET | Freq: Four times a day (QID) | ORAL | Status: DC
  Administered 2013-12-25 – 2013-12-26 (×6): 600 mg via ORAL
  Filled 2013-12-25 (×6): qty 1

## 2013-12-25 MED ORDER — OXYCODONE-ACETAMINOPHEN 5-325 MG PO TABS: 1.0000 | ORAL_TABLET | ORAL | Status: DC | PRN

## 2013-12-25 MED ORDER — BENZOCAINE-MENTHOL 20-0.5 % EX AERO
1.0000 "application " | INHALATION_SPRAY | CUTANEOUS | Status: DC | PRN
  Administered 2013-12-25: 1 via TOPICAL
  Filled 2013-12-25: qty 56

## 2013-12-25 MED ORDER — ONDANSETRON HCL 4 MG PO TABS: 4.0000 mg | ORAL_TABLET | ORAL | Status: DC | PRN

## 2013-12-25 MED ORDER — DIBUCAINE 1 % RE OINT
1.0000 "application " | TOPICAL_OINTMENT | RECTAL | Status: DC | PRN
  Administered 2013-12-25: 1 via RECTAL
  Filled 2013-12-25: qty 28

## 2013-12-25 MED ORDER — TETANUS-DIPHTH-ACELL PERTUSSIS 5-2.5-18.5 LF-MCG/0.5 IM SUSP: 0.5000 mL | Freq: Once | INTRAMUSCULAR | Status: DC

## 2013-12-25 MED ORDER — ONDANSETRON HCL 4 MG/2ML IJ SOLN
4.0000 mg | INTRAMUSCULAR | Status: DC | PRN
Start: 2013-12-25 — End: 2013-12-26

## 2013-12-25 NOTE — Anesthesia Postprocedure Evaluation (Signed)
  Anesthesia Post-op Note  Patient: Felicia Klein  Procedure(s) Performed: * No procedures listed *  Patient Location: Mother/Baby  Anesthesia Type:Epidural  Level of Consciousness: awake, alert , oriented and patient cooperative  Airway and Oxygen Therapy: Patient Spontanous Breathing  Post-op Pain: mild  Post-op Assessment: Patient's Cardiovascular Status Stable, Respiratory Function Stable, No headache, No backache, No residual numbness and No residual motor weakness  Post-op Vital Signs: stable  Last Vitals:  Filed Vitals:   12/25/13 0630  BP: 124/73  Pulse: 87  Temp: 37 C  Resp: 18    Complications: No apparent anesthesia complications

## 2013-12-25 NOTE — Progress Notes (Signed)
Post Partum Day 0 Subjective: no complaints, up ad lib, voiding, tolerating PO, + flatus and breast feeding  Objective: Blood pressure 124/73, pulse 87, temperature 98.6 F (37 C), temperature source Oral, resp. rate 18, height 5\' 9"  (1.753 m), weight 79.833 kg (176 lb), last menstrual period 03/21/2013, SpO2 100.00%, unknown if currently breastfeeding.  Physical Exam:  General: alert, cooperative and no distress Lochia: appropriate Uterine Fundus: firm Incision: healing well DVT Evaluation: No evidence of DVT seen on physical exam. Negative Homan's sign. No cords or calf tenderness.   Recent Labs  12/24/13 0800 12/25/13 0325  HGB 11.7* 12.1  HCT 34.8* 34.8*    Assessment/Plan: Breastfeeding and Lactation consult   LOS: 1 day   Felicia Klein [name] 12/25/2013, 9:03 AM

## 2013-12-25 NOTE — Lactation Note (Signed)
This note was copied from the chart of Felicia Lauralyn Primeslicia Cokley. Lactation Consultation Note: Initial visit with mom. Baby now 6 hours old. She is awake- assisted with latch. Reviewed feeding cues and encouraged to feed whenever she sees them. Baby latched well- still nursing when I left room. BF brochure given with resources for support after DC. No questions at present.   Patient Name: Felicia Klein Today's Date: 12/25/2013 Reason for consult: Initial assessment   Maternal Data Formula Feeding for Exclusion: No Infant to breast within first hour of birth: Yes Does the patient have breastfeeding experience prior to this delivery?: No  Feeding Feeding Type: Breast Fed  LATCH Score/Interventions Latch: Grasps breast easily, tongue down, lips flanged, rhythmical sucking.  Audible Swallowing: A few with stimulation  Type of Nipple: Everted at rest and after stimulation  Comfort (Breast/Nipple): Soft / non-tender     Hold (Positioning): Assistance needed to correctly position infant at breast and maintain latch. Intervention(s): Breastfeeding basics reviewed;Support Pillows;Position options;Skin to skin  LATCH Score: 8  Lactation Tools Discussed/Used     Consult Status Consult Status: Follow-up Date: 12/26/13 Follow-up type: In-patient    Pamelia HoitWeeks, Tanielle Emigh D 12/25/2013, 9:28 AM

## 2013-12-26 LAB — CBC
HCT: 31.4 % — ABNORMAL LOW (ref 36.0–46.0)
Hemoglobin: 10.6 g/dL — ABNORMAL LOW (ref 12.0–15.0)
MCH: 30.6 pg (ref 26.0–34.0)
MCHC: 33.8 g/dL (ref 30.0–36.0)
MCV: 90.8 fL (ref 78.0–100.0)
Platelets: 159 10*3/uL (ref 150–400)
RBC: 3.46 MIL/uL — ABNORMAL LOW (ref 3.87–5.11)
RDW: 13.8 % (ref 11.5–15.5)
WBC: 14 10*3/uL — ABNORMAL HIGH (ref 4.0–10.5)

## 2013-12-26 NOTE — Discharge Summary (Addendum)
Obstetric Discharge Summary Reason for Admission: induction of labor for proteinuria Prenatal Procedures: none Intrapartum Procedures: spontaneous vaginal delivery Postpartum Procedures: none Complications-Operative and Postpartum: first degree Hemoglobin  Date Value Ref Range Status  12/26/2013 10.6* 12.0 - 15.0 g/dL Final     HCT  Date Value Ref Range Status  12/26/2013 31.4* 36.0 - 46.0 % Final    Discharge Diagnoses: Term Pregnancy-delivered  Discharge Information: Date: 12/26/2013 Activity: pelvic rest Diet: routine Medications: Ibuprofen Condition: stable Instructions: refer to practice specific booklet Discharge to: home Follow-up Information   Follow up with Almon HerculesOSS,KENDRA H., MD In 4 weeks.   Specialty:  Obstetrics and Gynecology   Contact information:   [address] [place] [id number] [phone]       Newborn Data: Live born female  Birth Weight: 8 lb 2.7 oz (3705 g) APGAR: 7, 9  Home with mother.  Felicia Klein A 12/26/2013, 8:00 AM

## 2013-12-26 NOTE — Progress Notes (Signed)
Patient is eating, ambulating, voiding.  Pain control is good.  Filed Vitals:   12/25/13 1100 12/25/13 1725 12/25/13 2019 12/26/13 0545  BP: 105/49 119/63 121/77 112/47  Pulse: 68 62 70 67  Temp: 98.4 F (36.9 C) 98 F (36.7 C) 97.8 F (36.6 C) 97.8 F (36.6 C)  TempSrc: Oral Oral Oral Oral  Resp: 18 19 18 18   Height:      Weight:      SpO2:        Fundus firm Perineum without swelling.  Lab Results  Component Value Date   WBC 14.0* 12/26/2013   HGB 10.6* 12/26/2013   HCT 31.4* 12/26/2013   MCV 90.8 12/26/2013   PLT 159 12/26/2013    A/Positive/-- (01/23 0000)/RI  A/P Post partum day 1.  Routine care.  Expect d/c routine.    Fleta Borgeson A

## 2014-04-07 ENCOUNTER — Encounter (HOSPITAL_COMMUNITY): Payer: Self-pay

## 2018-09-06 ENCOUNTER — Telehealth: Payer: Self-pay | Admitting: *Deleted

## 2018-09-06 DIAGNOSIS — O3680X0 Pregnancy with inconclusive fetal viability, not applicable or unspecified: Secondary | ICD-10-CM

## 2018-09-06 NOTE — Telephone Encounter (Signed)
Received a call from  [facility] RN Lytle Michaels stating she saw this patient today for ultrasound and she was able to locate gestational sac and embryo that measured [redacted]w[redacted]d. States she could not detect FHR. States patient denies bleeding or cramping  and she is referring patient to [place] for ultrasound .  [facility] and let them know we are assuming care and verified patient name, DOB, and telephone contact information.   I discussed with Dr. Shawnie Pons and will order [place] for 1-2 weeks.   I called patient and gave her first available [place] appointment for 1-2 weeks of 09/17/18 at 0800. I explained she will come to our office afterwards for results. She voices understanding. We also discussed if she has bleeding prior to that to go to the hospital for evaluation.

## 2018-09-13 ENCOUNTER — Telehealth: Payer: Self-pay | Admitting: Family Medicine

## 2018-09-13 NOTE — Telephone Encounter (Signed)
Attempted to call patient to let her know about the new office address. A man answer and stated that this is not her number and that we keep calling it. Noted patient's appointment note to update number.

## 2018-09-17 ENCOUNTER — Ambulatory Visit (HOSPITAL_COMMUNITY)
Admission: RE | Admit: 2018-09-17 | Discharge: 2018-09-17 | Disposition: A | Discharge status: Home / Self Care | Payer: BC Managed Care – PPO | Source: Ambulatory Visit | Attending: Family Medicine | Admitting: Family Medicine

## 2018-09-17 ENCOUNTER — Other Ambulatory Visit: Payer: Self-pay | Admitting: Family Medicine

## 2018-09-17 ENCOUNTER — Other Ambulatory Visit: Payer: Self-pay

## 2018-09-17 ENCOUNTER — Ambulatory Visit: Payer: BC Managed Care – PPO

## 2018-09-17 ENCOUNTER — Telehealth: Payer: Self-pay | Admitting: General Practice

## 2018-09-17 DIAGNOSIS — O3680X0 Pregnancy with inconclusive fetal viability, not applicable or unspecified: Secondary | ICD-10-CM

## 2018-09-17 NOTE — Telephone Encounter (Signed)
Attempted to call patient with ultrasound results & phone number was incorrect. Will send mychart message.

## 2018-09-19 ENCOUNTER — Other Ambulatory Visit (HOSPITAL_COMMUNITY): Payer: Self-pay | Admitting: Obstetrics and Gynecology

## 2018-09-19 DIAGNOSIS — O039 Complete or unspecified spontaneous abortion without complication: Secondary | ICD-10-CM

## 2018-09-20 ENCOUNTER — Ambulatory Visit (HOSPITAL_COMMUNITY): Admission: RE | Admit: 2018-09-20 | Payer: BC Managed Care – PPO | Source: Ambulatory Visit

## 2019-02-01 ENCOUNTER — Other Ambulatory Visit: Payer: Self-pay | Admitting: Obstetrics and Gynecology

## 2019-02-01 DIAGNOSIS — N6320 Unspecified lump in the left breast, unspecified quadrant: Secondary | ICD-10-CM

## 2019-03-06 LAB — OB RESULTS CONSOLE HEPATITIS B SURFACE ANTIGEN: Hepatitis B Surface Ag: NEGATIVE

## 2019-03-06 LAB — OB RESULTS CONSOLE HIV ANTIBODY (ROUTINE TESTING): HIV: NONREACTIVE

## 2019-03-06 LAB — OB RESULTS CONSOLE GC/CHLAMYDIA
Chlamydia: NEGATIVE
Gonorrhea: NEGATIVE

## 2019-03-06 LAB — OB RESULTS CONSOLE RUBELLA ANTIBODY, IGM: Rubella: IMMUNE

## 2019-03-06 LAB — OB RESULTS CONSOLE ANTIBODY SCREEN: Antibody Screen: NEGATIVE

## 2019-03-06 LAB — OB RESULTS CONSOLE ABO/RH: RH Type: POSITIVE

## 2019-03-06 LAB — OB RESULTS CONSOLE RPR: RPR: NONREACTIVE

## 2019-07-30 ENCOUNTER — Inpatient Hospital Stay (HOSPITAL_BASED_OUTPATIENT_CLINIC_OR_DEPARTMENT_OTHER): Payer: BC Managed Care – PPO

## 2019-07-30 ENCOUNTER — Other Ambulatory Visit: Payer: Self-pay

## 2019-07-30 ENCOUNTER — Observation Stay (HOSPITAL_COMMUNITY)
Admission: AD | Admit: 2019-07-30 | Discharge: 2019-07-31 | Disposition: A | Discharge status: Home / Self Care | Payer: BC Managed Care – PPO | Attending: Obstetrics and Gynecology | Admitting: Obstetrics and Gynecology

## 2019-07-30 ENCOUNTER — Encounter (HOSPITAL_COMMUNITY): Payer: Self-pay | Admitting: Obstetrics and Gynecology

## 2019-07-30 DIAGNOSIS — W19XXXA Unspecified fall, initial encounter: Secondary | ICD-10-CM | POA: Diagnosis not present

## 2019-07-30 DIAGNOSIS — W000XXA Fall on same level due to ice and snow, initial encounter: Secondary | ICD-10-CM | POA: Diagnosis not present

## 2019-07-30 DIAGNOSIS — O9A213 Injury, poisoning and certain other consequences of external causes complicating pregnancy, third trimester: Secondary | ICD-10-CM

## 2019-07-30 DIAGNOSIS — Y929 Unspecified place or not applicable: Secondary | ICD-10-CM | POA: Diagnosis not present

## 2019-07-30 DIAGNOSIS — Y939 Activity, unspecified: Secondary | ICD-10-CM | POA: Diagnosis not present

## 2019-07-30 DIAGNOSIS — Y999 Unspecified external cause status: Secondary | ICD-10-CM | POA: Insufficient documentation

## 2019-07-30 DIAGNOSIS — J45909 Unspecified asthma, uncomplicated: Secondary | ICD-10-CM | POA: Diagnosis not present

## 2019-07-30 DIAGNOSIS — Z3A33 33 weeks gestation of pregnancy: Secondary | ICD-10-CM | POA: Insufficient documentation

## 2019-07-30 DIAGNOSIS — S3991XA Unspecified injury of abdomen, initial encounter: Secondary | ICD-10-CM | POA: Diagnosis not present

## 2019-07-30 DIAGNOSIS — Z20822 Contact with and (suspected) exposure to covid-19: Secondary | ICD-10-CM | POA: Insufficient documentation

## 2019-07-30 LAB — URINALYSIS, ROUTINE W REFLEX MICROSCOPIC
Bilirubin Urine: NEGATIVE
Glucose, UA: NEGATIVE mg/dL
Ketones, ur: NEGATIVE mg/dL
Nitrite: NEGATIVE
Protein, ur: 30 mg/dL — AB
Specific Gravity, Urine: 1.013 (ref 1.005–1.030)
pH: 7 (ref 5.0–8.0)

## 2019-07-30 LAB — TYPE AND SCREEN
ABO/RH(D): A POS
Antibody Screen: NEGATIVE

## 2019-07-30 LAB — SARS CORONAVIRUS 2 (TAT 6-24 HRS): SARS Coronavirus 2: NEGATIVE

## 2019-07-30 LAB — ABO/RH: ABO/RH(D): A POS

## 2019-07-30 MED ORDER — LACTATED RINGERS IV BOLUS
500.0000 mL | Freq: Once | INTRAVENOUS | Status: AC
  Administered 2019-07-30: 500 mL via INTRAVENOUS

## 2019-07-30 MED ORDER — PRENATAL MULTIVITAMIN CH: 1.0000 | ORAL_TABLET | Freq: Every day | ORAL | Status: DC

## 2019-07-30 MED ORDER — ACETAMINOPHEN 325 MG PO TABS
650.0000 mg | ORAL_TABLET | ORAL | Status: DC | PRN
  Administered 2019-07-30: 650 mg via ORAL
  Filled 2019-07-30: qty 2

## 2019-07-30 MED ORDER — CALCIUM CARBONATE ANTACID 500 MG PO CHEW: 2.0000 | CHEWABLE_TABLET | ORAL | Status: DC | PRN

## 2019-07-30 MED ORDER — ZOLPIDEM TARTRATE 5 MG PO TABS: 5.0000 mg | ORAL_TABLET | Freq: Every evening | ORAL | Status: DC | PRN

## 2019-07-30 MED ORDER — DOCUSATE SODIUM 100 MG PO CAPS
100.0000 mg | ORAL_CAPSULE | Freq: Every day | ORAL | Status: DC
  Administered 2019-07-31: 10:00:00 100 mg via ORAL
  Filled 2019-07-30: qty 1

## 2019-07-30 NOTE — MAU Note (Signed)
Was getting out of the car this morning, slipped and fell onto rt side, hit all the way to face. Now rt side is sore, and this is the side he usually lays on. +FM. No bleeding or leaking. Was checked by paramedic.

## 2019-07-30 NOTE — H&P (Signed)
Pattijo Juste is an 29 y.o. G2P1001 [redacted]w[redacted]d female who presented to the ER at the suggestion of EMS after she had a significant fall this am. Pt reports getting out of car at work, then slipped on ice and fell rather hard on her side. She did strike her abd. She did not lose consciousness. She reports no abd pain or bleeding. Good FM  Past Medical History:  Diagnosis Date  . Asthma    childhood  . Cellulitis    legs  . Eczema   . Hx of varicella     History reviewed. No pertinent surgical history.  Family History  Problem Relation Age of Onset  . Diabetes Father   . Diabetes Maternal Grandmother   . Cancer Maternal Grandmother        peritoneal cancer-dec age 77  . Hypertension Maternal Grandmother   . Diabetes Paternal Grandmother   . Thyroid disease Paternal Grandmother    Social History:  reports that she has never smoked. She has never used smokeless tobacco. She reports previous alcohol use. She reports that she does not use drugs.  Allergies: No Known Allergies  Medications Prior to Admission  Medication Sig Dispense Refill  . Prenatal Vit-Fe Fumarate-FA (PRENATAL MULTIVITAMIN) TABS tablet Take 1 tablet by mouth daily at 12 noon.         Blood pressure 107/62, pulse 88, temperature 97.9 F (36.6 C), temperature source Oral, resp. rate 18, height 5\' 8"  (1.727 m), weight 83.2 kg, SpO2 100 %, unknown if currently breastfeeding. Exam per midlevel Lab Results  Component Value Date   WBC 14.0 (H) 12/26/2013   HGB 10.6 (L) 12/26/2013   HCT 31.4 (L) 12/26/2013   MCV 90.8 12/26/2013   PLT 159 12/26/2013   Lab Results  Component Value Date   PREGTESTUR Positive 05/28/2013      Patient Active Problem List   Diagnosis Date Noted  . Fall 07/30/2019  . Normal delivery 12/25/2013  . Indication for care in labor or delivery 12/24/2013  . Fibrocystic breast disease 11/27/2012   IMP/ IUP at 33+ wks with abd trauma after fall Plan/ Will check u/s          24 hour  continuous monitoring  Cheridan Kibler E 07/30/2019, 10:50 PM

## 2019-07-30 NOTE — OB Triage Provider Note (Signed)
History     CSN: [id number]  Arrival date and time: 07/30/19 3220   First Provider Initiated Contact with Patient 07/30/19 1131      Chief Complaint  Patient presents with  . Fall   HPI   Ms.Felicia Klein is a 29 y.o. female G2P1001 @ [redacted]w[redacted]d here in MAU after a fall that occurred this morning around 0745 while at work. She was getting out of her car and slipped on some ice. She fell on her right side; she hit the right side of her hip, abdomen, and right side of her face. She has some discomfort over her right hip, however does not feel it is severe pain. She is able to ambulate. She has had braxton hicks contractions and cramping in her lower abdomen since the fall. No bleeding. + fetal movement. She has not taken any medications.   OB History    Gravida  2   Para  1   Term  1   Preterm      AB      Living  1     SAB      TAB      Ectopic      Multiple      Live Births  1           Past Medical History:  Diagnosis Date  . Asthma    childhood  . Cellulitis    legs  . Eczema   . Hx of varicella     History reviewed. No pertinent surgical history.  Family History  Problem Relation Age of Onset  . Diabetes Father   . Diabetes Maternal Grandmother   . Cancer Maternal Grandmother        peritoneal cancer-dec age 101  . Hypertension Maternal Grandmother   . Diabetes Paternal Grandmother   . Thyroid disease Paternal Grandmother     Social History   Tobacco Use  . Smoking status: Never Smoker  . Smokeless tobacco: Never Used  Substance Use Topics  . Alcohol use: Not Currently  . Drug use: No    Allergies: No Known Allergies  Medications Prior to Admission  Medication Sig Dispense Refill Last Dose  . Prenatal Vit-Fe Fumarate-FA (PRENATAL MULTIVITAMIN) TABS tablet Take 1 tablet by mouth daily at 12 noon.      Results for orders placed or performed during the hospital encounter of 07/30/19 (from the past 48 hour(s))  Urinalysis, Routine w  reflex microscopic     Status: Abnormal   Collection Time: 07/30/19 10:49 AM  Result Value Ref Range   Color, Urine YELLOW YELLOW   APPearance HAZY (A) CLEAR   Specific Gravity, Urine 1.013 1.005 - 1.030   pH 7.0 5.0 - 8.0   Glucose, UA NEGATIVE NEGATIVE mg/dL   Hgb urine dipstick SMALL (A) NEGATIVE   Bilirubin Urine NEGATIVE NEGATIVE   Ketones, ur NEGATIVE NEGATIVE mg/dL   Protein, ur 30 (A) NEGATIVE mg/dL   Nitrite NEGATIVE NEGATIVE   Leukocytes,Ua LARGE (A) NEGATIVE   RBC / HPF 6-10 0 - 5 RBC/hpf   WBC, UA 21-50 0 - 5 WBC/hpf   Bacteria, UA FEW (A) NONE SEEN   Squamous Epithelial / LPF 6-10 0 - 5   Mucus PRESENT     Comment: Performed at [facility], [address] [place], [place] [address]  Type and screen [facility]     Status: None   Collection Time: 07/30/19 12:57 PM  Result Value Ref  Range   ABO/RH(D) A POS    Antibody Screen NEG    Sample Expiration      08/02/2019,2359 Performed at [facility], [place] [address], [place], [place] [address]     Review of Systems  Gastrointestinal: Negative for abdominal pain.  Genitourinary: Negative for vaginal bleeding.  Musculoskeletal: Positive for myalgias.   Physical Exam   Blood pressure 113/70, pulse 96, temperature 98.2 F (36.8 C), temperature source Oral, resp. rate 18, height 5\' 8"  (1.727 m), weight 83.2 kg, SpO2 99 %, unknown if currently breastfeeding.  Physical Exam  Constitutional: She is oriented to person, place, and time. She appears well-developed and well-nourished. No distress.  HENT:  Head: Normocephalic.  Eyes: Pupils are equal, round, and reactive to light.  GI: Soft. She exhibits no distension. There is no abdominal tenderness. There is no rebound and no guarding.  Musculoskeletal:        General: Normal range of motion.  Neurological: She is alert and oriented to person, place, and time.  Skin: Skin is warm. She is not diaphoretic.  Psychiatric: Her behavior is  normal.   Fetal Tracing: Baseline: 140 bpm  Variability: Moderate  Accelerations: 15x15 Decelerations: None Toco: UI   MAU Course  Procedures  None  MDM  NST  Discussed patient with Dr. Ouida Sills. Recommend admission d/t UI, fall with contract of the abdomen.  Tylenol 1 gram.   Assessment and Plan   A:  1. Fall, initial encounter   2. [redacted] weeks gestation of pregnancy     P:  Admit to Ante  Continue monitoring  Nathaneal Sommers, Artist Pais, NP 07/30/2019 1:56 PM

## 2019-07-31 LAB — CULTURE, OB URINE: Culture: NO GROWTH

## 2019-07-31 MED ORDER — CYCLOBENZAPRINE HCL 10 MG PO TABS: 10.0000 mg | ORAL_TABLET | Freq: Three times a day (TID) | ORAL | 0 refills | Status: DC | PRN

## 2019-07-31 MED ORDER — ACETAMINOPHEN 325 MG PO TABS: 650.0000 mg | ORAL_TABLET | Freq: Four times a day (QID) | ORAL | 0 refills | Status: DC | PRN

## 2019-07-31 NOTE — Discharge Summary (Signed)
Obstetric Discharge Summary Reason for Admission: Fall Prenatal Procedures: ultrasound Observation on antepartum with continuous monitoring Hemoglobin  Date Value Ref Range Status  12/26/2013 10.6 (L) 12.0 - 15.0 g/dL Final   HCT  Date Value Ref Range Status  12/26/2013 31.4 (L) 36.0 - 46.0 % Final    Physical Exam:  General: alert, cooperative and appears stated age Discharge Diagnoses: Fall  Discharge Information: Date: 07/31/2019 Activity: pelvic rest Diet: routine Medications: Flexeril Condition: stable Instructions: refer to practice specific booklet Discharge to: home Follow-up Information    Carrington Clamp, MD Follow up on 08/07/2019.   Specialty: Obstetrics and Gynecology Contact information: [address] [place] [address] [phone]          Waynard Reeds 07/31/2019, 10:44 AM

## 2019-07-31 NOTE — Progress Notes (Signed)
Discharge instructions and medications reviewed with patient. Patient verbalized understanding.   Pt walked off unit with support person and NT.

## 2019-09-02 LAB — OB RESULTS CONSOLE GBS: GBS: NEGATIVE

## 2019-09-11 ENCOUNTER — Telehealth (HOSPITAL_COMMUNITY): Payer: Self-pay | Admitting: *Deleted

## 2019-09-11 ENCOUNTER — Other Ambulatory Visit: Payer: Self-pay | Admitting: Obstetrics and Gynecology

## 2019-09-12 ENCOUNTER — Encounter (HOSPITAL_COMMUNITY): Payer: Self-pay | Admitting: *Deleted

## 2019-09-12 NOTE — Telephone Encounter (Signed)
Preadmission screen  

## 2019-09-13 ENCOUNTER — Telehealth (HOSPITAL_COMMUNITY): Payer: Self-pay | Admitting: *Deleted

## 2019-09-13 ENCOUNTER — Encounter (HOSPITAL_COMMUNITY): Payer: Self-pay | Admitting: *Deleted

## 2019-09-13 NOTE — Telephone Encounter (Signed)
Preadmission screen  

## 2019-09-14 ENCOUNTER — Other Ambulatory Visit (HOSPITAL_COMMUNITY)
Admission: RE | Admit: 2019-09-14 | Discharge: 2019-09-14 | Disposition: A | Discharge status: Home / Self Care | Payer: BC Managed Care – PPO | Source: Ambulatory Visit | Attending: Obstetrics and Gynecology | Admitting: Obstetrics and Gynecology

## 2019-09-14 DIAGNOSIS — Z20822 Contact with and (suspected) exposure to covid-19: Secondary | ICD-10-CM | POA: Insufficient documentation

## 2019-09-14 DIAGNOSIS — Z01812 Encounter for preprocedural laboratory examination: Secondary | ICD-10-CM | POA: Insufficient documentation

## 2019-09-14 LAB — SARS CORONAVIRUS 2 (TAT 6-24 HRS): SARS Coronavirus 2: NEGATIVE

## 2019-09-16 ENCOUNTER — Inpatient Hospital Stay (HOSPITAL_COMMUNITY): Payer: BC Managed Care – PPO | Admitting: Anesthesiology

## 2019-09-16 ENCOUNTER — Encounter (HOSPITAL_COMMUNITY): Payer: Self-pay | Admitting: Obstetrics and Gynecology

## 2019-09-16 ENCOUNTER — Inpatient Hospital Stay (HOSPITAL_COMMUNITY): Payer: BC Managed Care – PPO

## 2019-09-16 ENCOUNTER — Other Ambulatory Visit: Payer: Self-pay

## 2019-09-16 ENCOUNTER — Inpatient Hospital Stay (HOSPITAL_COMMUNITY)
Admission: AD | Admit: 2019-09-16 | Discharge: 2019-09-19 | DRG: 788 | Disposition: A | Discharge status: Home / Self Care | Payer: BC Managed Care – PPO | Attending: Obstetrics and Gynecology | Admitting: Obstetrics and Gynecology

## 2019-09-16 DIAGNOSIS — E669 Obesity, unspecified: Secondary | ICD-10-CM | POA: Diagnosis present

## 2019-09-16 DIAGNOSIS — Z3A40 40 weeks gestation of pregnancy: Secondary | ICD-10-CM

## 2019-09-16 DIAGNOSIS — O99214 Obesity complicating childbirth: Secondary | ICD-10-CM | POA: Diagnosis present

## 2019-09-16 DIAGNOSIS — Z3490 Encounter for supervision of normal pregnancy, unspecified, unspecified trimester: Secondary | ICD-10-CM | POA: Diagnosis present

## 2019-09-16 DIAGNOSIS — O9081 Anemia of the puerperium: Secondary | ICD-10-CM | POA: Diagnosis not present

## 2019-09-16 DIAGNOSIS — O480 Post-term pregnancy: Secondary | ICD-10-CM | POA: Diagnosis present

## 2019-09-16 LAB — CBC
HCT: 35.6 % — ABNORMAL LOW (ref 36.0–46.0)
Hemoglobin: 11.6 g/dL — ABNORMAL LOW (ref 12.0–15.0)
MCH: 29.2 pg (ref 26.0–34.0)
MCHC: 32.6 g/dL (ref 30.0–36.0)
MCV: 89.7 fL (ref 80.0–100.0)
Platelets: 222 10*3/uL (ref 150–400)
RBC: 3.97 MIL/uL (ref 3.87–5.11)
RDW: 14.4 % (ref 11.5–15.5)
WBC: 9.6 10*3/uL (ref 4.0–10.5)
nRBC: 0 % (ref 0.0–0.2)

## 2019-09-16 LAB — TYPE AND SCREEN
ABO/RH(D): A POS
Antibody Screen: NEGATIVE

## 2019-09-16 LAB — RPR: RPR Ser Ql: NONREACTIVE

## 2019-09-16 MED ORDER — LACTATED RINGERS IV SOLN
500.0000 mL | INTRAVENOUS | Status: DC | PRN
  Administered 2019-09-16: 500 mL via INTRAVENOUS

## 2019-09-16 MED ORDER — OXYCODONE-ACETAMINOPHEN 5-325 MG PO TABS: 1.0000 | ORAL_TABLET | ORAL | Status: DC | PRN

## 2019-09-16 MED ORDER — OXYTOCIN 40 UNITS IN NORMAL SALINE INFUSION - SIMPLE MED: 2.5000 [IU]/h | INTRAVENOUS | Status: DC

## 2019-09-16 MED ORDER — LIDOCAINE HCL (PF) 1 % IJ SOLN: 30.0000 mL | INTRAMUSCULAR | Status: DC | PRN

## 2019-09-16 MED ORDER — EPHEDRINE 5 MG/ML INJ: 10.0000 mg | INTRAVENOUS | Status: DC | PRN

## 2019-09-16 MED ORDER — SOD CITRATE-CITRIC ACID 500-334 MG/5ML PO SOLN
30.0000 mL | ORAL | Status: DC | PRN
  Administered 2019-09-17: 30 mL via ORAL
  Filled 2019-09-16: qty 30

## 2019-09-16 MED ORDER — ONDANSETRON HCL 4 MG/2ML IJ SOLN: 4.0000 mg | Freq: Four times a day (QID) | INTRAMUSCULAR | Status: DC | PRN

## 2019-09-16 MED ORDER — LIDOCAINE-EPINEPHRINE (PF) 2 %-1:200000 IJ SOLN
INTRAMUSCULAR | Status: DC | PRN
  Administered 2019-09-16: 3 mL via EPIDURAL
  Administered 2019-09-16: 2 mL via EPIDURAL
  Administered 2019-09-17 (×2): 5 mL via EPIDURAL

## 2019-09-16 MED ORDER — FENTANYL CITRATE (PF) 100 MCG/2ML IJ SOLN: 100.0000 ug | INTRAMUSCULAR | Status: DC | PRN

## 2019-09-16 MED ORDER — FENTANYL-BUPIVACAINE-NACL 0.5-0.125-0.9 MG/250ML-% EP SOLN
12.0000 mL/h | EPIDURAL | Status: DC | PRN
  Filled 2019-09-16: qty 250

## 2019-09-16 MED ORDER — SODIUM CHLORIDE (PF) 0.9 % IJ SOLN
INTRAMUSCULAR | Status: DC | PRN
  Administered 2019-09-16: 12 mL/h via EPIDURAL

## 2019-09-16 MED ORDER — ACETAMINOPHEN 325 MG PO TABS
650.0000 mg | ORAL_TABLET | ORAL | Status: DC | PRN
  Administered 2019-09-16: 650 mg via ORAL
  Filled 2019-09-16: qty 2

## 2019-09-16 MED ORDER — PHENYLEPHRINE 40 MCG/ML (10ML) SYRINGE FOR IV PUSH (FOR BLOOD PRESSURE SUPPORT): 80.0000 ug | PREFILLED_SYRINGE | INTRAVENOUS | Status: DC | PRN

## 2019-09-16 MED ORDER — LACTATED RINGERS IV SOLN: INTRAVENOUS | Status: DC

## 2019-09-16 MED ORDER — OXYTOCIN BOLUS FROM INFUSION: 500.0000 mL | Freq: Once | INTRAVENOUS | Status: DC

## 2019-09-16 MED ORDER — TERBUTALINE SULFATE 1 MG/ML IJ SOLN: 0.2500 mg | Freq: Once | INTRAMUSCULAR | Status: DC | PRN

## 2019-09-16 MED ORDER — DIPHENHYDRAMINE HCL 50 MG/ML IJ SOLN: 12.5000 mg | INTRAMUSCULAR | Status: DC | PRN

## 2019-09-16 MED ORDER — PHENYLEPHRINE 40 MCG/ML (10ML) SYRINGE FOR IV PUSH (FOR BLOOD PRESSURE SUPPORT)
80.0000 ug | PREFILLED_SYRINGE | INTRAVENOUS | Status: DC | PRN
  Filled 2019-09-16: qty 10

## 2019-09-16 MED ORDER — OXYTOCIN 40 UNITS IN NORMAL SALINE INFUSION - SIMPLE MED
1.0000 m[IU]/min | INTRAVENOUS | Status: DC
  Administered 2019-09-16: 2 m[IU]/min via INTRAVENOUS
  Filled 2019-09-16: qty 1000

## 2019-09-16 MED ORDER — SODIUM CHLORIDE 0.9 % IV SOLN
2.0000 g | Freq: Four times a day (QID) | INTRAVENOUS | Status: DC
  Administered 2019-09-16: 2 g via INTRAVENOUS
  Filled 2019-09-16 (×3): qty 2000

## 2019-09-16 MED ORDER — MISOPROSTOL 25 MCG QUARTER TABLET
25.0000 ug | ORAL_TABLET | ORAL | Status: DC | PRN
  Administered 2019-09-16 (×2): 25 ug via VAGINAL
  Filled 2019-09-16 (×2): qty 1

## 2019-09-16 MED ORDER — OXYCODONE-ACETAMINOPHEN 5-325 MG PO TABS: 2.0000 | ORAL_TABLET | ORAL | Status: DC | PRN

## 2019-09-16 MED ORDER — LACTATED RINGERS IV SOLN: 500.0000 mL | Freq: Once | INTRAVENOUS | Status: DC

## 2019-09-16 NOTE — Anesthesia Procedure Notes (Signed)
Epidural Patient location during procedure: OB Start time: 09/16/2019 9:20 AM End time: 09/16/2019 9:35 AM  Staffing Anesthesiologist: Elmer Picker, MD Performed: anesthesiologist   Preanesthetic Checklist Completed: patient identified, IV checked, risks and benefits discussed, monitors and equipment checked, pre-op evaluation and timeout performed  Epidural Patient position: sitting Prep: DuraPrep and site prepped and draped Patient monitoring: continuous pulse ox, blood pressure, heart rate and cardiac monitor Approach: midline Location: L3-L4 Injection technique: LOR air  Needle:  Needle type: Tuohy  Needle gauge: 17 G Needle length: 9 cm Needle insertion depth: 6.5 cm Catheter type: closed end flexible Catheter size: 19 Gauge Catheter at skin depth: 12 cm Test dose: negative  Assessment Sensory level: T8 Events: blood not aspirated, injection not painful, no injection resistance, no paresthesia and negative IV test  Additional Notes Patient identified. Risks/Benefits/Options discussed with patient including but not limited to bleeding, infection, nerve damage, paralysis, failed block, incomplete pain control, headache, blood pressure changes, nausea, vomiting, reactions to medication both or allergic, itching and postpartum back pain. Confirmed with bedside nurse the patient's most recent platelet count. Confirmed with patient that they are not currently taking any anticoagulation, have any bleeding history or any family history of bleeding disorders. Patient expressed understanding and wished to proceed. All questions were answered. Sterile technique was used throughout the entire procedure. Please see nursing notes for vital signs. Test dose was given through epidural catheter and negative prior to continuing to dose epidural or start infusion. Warning signs of high block given to the patient including shortness of breath, tingling/numbness in hands, complete motor block,  or any concerning symptoms with instructions to call for help. Patient was given instructions on fall risk and not to get out of bed. All questions and concerns addressed with instructions to call with any issues or inadequate analgesia.  Reason for block:procedure for pain

## 2019-09-16 NOTE — Anesthesia Preprocedure Evaluation (Signed)

## 2019-09-16 NOTE — H&P (Signed)
Felicia Klein is a 29 y.o. female presenting for PD IOL  29 yo G3P1011 @ 40+3 presents for PD IOL  She had an elevated HgbA1c in first trimester. She failed 1hr gtt but passed 3 hr   OB History    Gravida  3   Para  1   Term  1   Preterm      AB  1   Living  1     SAB  1   TAB      Ectopic      Multiple      Live Births  1          Past Medical History:  Diagnosis Date  . Asthma    childhood  . Cellulitis    legs  . Eczema   . Hx of varicella    History reviewed. No pertinent surgical history. Family History: family history includes Cancer in her maternal grandmother; Diabetes in her father, maternal grandmother, and paternal grandmother; Hypertension in her maternal grandmother and mother; Thyroid disease in her paternal grandmother. Social History:  reports that she has never smoked. She has never used smokeless tobacco. She reports previous alcohol use. She reports that she does not use drugs.     Maternal Diabetes: No Genetic Screening: Normal Maternal Ultrasounds/Referrals: Normal Fetal Ultrasounds or other Referrals:  None Maternal Substance Abuse:  No Significant Maternal Medications:  None Significant Maternal Lab Results:  None Other Comments:  None  Review of Systems History Dilation: 3.5 Effacement (%): 70 Station: -3 Exam by:: Dr. Tenny Craw Blood pressure 116/70, pulse 68, temperature 98.6 F (37 C), temperature source Oral, resp. rate 16, height 5\' 8"  (1.727 m), weight 90.3 kg, unknown if currently breastfeeding. Exam Physical Exam  Prenatal labs: ABO, Rh: --/--/A POS (04/12 0044) Antibody: NEG (04/12 0044) Rubella: Immune (09/30 0000) RPR: Nonreactive (09/30 0000)  HBsAg: Negative (09/30 0000)  HIV: Non-reactive (09/30 0000)  GBS: Negative/-- (03/29 0000)   Assessment/Plan: 1) Admit 2) AROM/pit 3) Epidural on request    10-14-1983 09/16/2019, 9:14 AM

## 2019-09-17 ENCOUNTER — Encounter (HOSPITAL_COMMUNITY)
Admission: AD | Disposition: A | Discharge status: Home / Self Care | Payer: Self-pay | Source: Home / Self Care | Attending: Obstetrics and Gynecology

## 2019-09-17 ENCOUNTER — Encounter (HOSPITAL_COMMUNITY): Payer: Self-pay | Admitting: Obstetrics and Gynecology

## 2019-09-17 LAB — CBC
HCT: 25.6 % — ABNORMAL LOW (ref 36.0–46.0)
Hemoglobin: 8.2 g/dL — ABNORMAL LOW (ref 12.0–15.0)
MCH: 28.7 pg (ref 26.0–34.0)
MCHC: 32 g/dL (ref 30.0–36.0)
MCV: 89.5 fL (ref 80.0–100.0)
Platelets: 173 K/uL (ref 150–400)
RBC: 2.86 MIL/uL — ABNORMAL LOW (ref 3.87–5.11)
RDW: 14.4 % (ref 11.5–15.5)
WBC: 14.2 K/uL — ABNORMAL HIGH (ref 4.0–10.5)
nRBC: 0 % (ref 0.0–0.2)

## 2019-09-17 SURGERY — Surgical Case
Anesthesia: Epidural | Wound class: Clean Contaminated

## 2019-09-17 MED ORDER — LIDOCAINE-EPINEPHRINE (PF) 2 %-1:200000 IJ SOLN
INTRAMUSCULAR | Status: AC
  Filled 2019-09-17: qty 10

## 2019-09-17 MED ORDER — OXYCODONE HCL 5 MG/5ML PO SOLN: 5.0000 mg | Freq: Once | ORAL | Status: DC | PRN

## 2019-09-17 MED ORDER — PHENYLEPHRINE 40 MCG/ML (10ML) SYRINGE FOR IV PUSH (FOR BLOOD PRESSURE SUPPORT)
PREFILLED_SYRINGE | INTRAVENOUS | Status: AC
  Filled 2019-09-17: qty 10

## 2019-09-17 MED ORDER — SCOPOLAMINE 1 MG/3DAYS TD PT72: 1.0000 | MEDICATED_PATCH | Freq: Once | TRANSDERMAL | Status: DC

## 2019-09-17 MED ORDER — OXYTOCIN 40 UNITS IN NORMAL SALINE INFUSION - SIMPLE MED
INTRAVENOUS | Status: AC
  Filled 2019-09-17: qty 1000

## 2019-09-17 MED ORDER — NALOXONE HCL 4 MG/10ML IJ SOLN
1.0000 ug/kg/h | INTRAVENOUS | Status: DC | PRN
  Filled 2019-09-17: qty 5

## 2019-09-17 MED ORDER — SIMETHICONE 80 MG PO CHEW
80.0000 mg | CHEWABLE_TABLET | ORAL | Status: DC
  Administered 2019-09-18: 80 mg via ORAL
  Filled 2019-09-17 (×2): qty 1

## 2019-09-17 MED ORDER — SIMETHICONE 80 MG PO CHEW: 80.0000 mg | CHEWABLE_TABLET | ORAL | Status: DC | PRN

## 2019-09-17 MED ORDER — OXYTOCIN 40 UNITS IN NORMAL SALINE INFUSION - SIMPLE MED: 2.5000 [IU]/h | INTRAVENOUS | Status: AC

## 2019-09-17 MED ORDER — ONDANSETRON HCL 4 MG/2ML IJ SOLN
INTRAMUSCULAR | Status: DC | PRN
  Administered 2019-09-17: 4 mg via INTRAVENOUS

## 2019-09-17 MED ORDER — METHYLERGONOVINE MALEATE 0.2 MG PO TABS: 0.2000 mg | ORAL_TABLET | ORAL | Status: DC | PRN

## 2019-09-17 MED ORDER — ALBUMIN HUMAN 5 % IV SOLN
INTRAVENOUS | Status: AC
  Filled 2019-09-17: qty 250

## 2019-09-17 MED ORDER — NALBUPHINE HCL 10 MG/ML IJ SOLN: 5.0000 mg | Freq: Once | INTRAMUSCULAR | Status: DC | PRN

## 2019-09-17 MED ORDER — ONDANSETRON HCL 4 MG/2ML IJ SOLN: 4.0000 mg | Freq: Once | INTRAMUSCULAR | Status: DC | PRN

## 2019-09-17 MED ORDER — MORPHINE SULFATE (PF) 0.5 MG/ML IJ SOLN
INTRAMUSCULAR | Status: DC | PRN
  Administered 2019-09-17: 3 mg via EPIDURAL

## 2019-09-17 MED ORDER — MENTHOL 3 MG MT LOZG: 1.0000 | LOZENGE | OROMUCOSAL | Status: DC | PRN

## 2019-09-17 MED ORDER — KETOROLAC TROMETHAMINE 30 MG/ML IJ SOLN
INTRAMUSCULAR | Status: AC
  Filled 2019-09-17: qty 1

## 2019-09-17 MED ORDER — ZOLPIDEM TARTRATE 5 MG PO TABS: 5.0000 mg | ORAL_TABLET | Freq: Every evening | ORAL | Status: DC | PRN

## 2019-09-17 MED ORDER — FERROUS FUMARATE 324 (106 FE) MG PO TABS
1.0000 | ORAL_TABLET | Freq: Two times a day (BID) | ORAL | Status: DC
  Administered 2019-09-17 – 2019-09-19 (×4): 106 mg via ORAL
  Filled 2019-09-17 (×4): qty 1

## 2019-09-17 MED ORDER — NALOXONE HCL 0.4 MG/ML IJ SOLN: 0.4000 mg | INTRAMUSCULAR | Status: DC | PRN

## 2019-09-17 MED ORDER — KETOROLAC TROMETHAMINE 30 MG/ML IJ SOLN
30.0000 mg | Freq: Once | INTRAMUSCULAR | Status: AC | PRN
  Administered 2019-09-17: 30 mg via INTRAVENOUS

## 2019-09-17 MED ORDER — ONDANSETRON HCL 4 MG/2ML IJ SOLN: 4.0000 mg | Freq: Three times a day (TID) | INTRAMUSCULAR | Status: DC | PRN

## 2019-09-17 MED ORDER — ACETAMINOPHEN 325 MG PO TABS
650.0000 mg | ORAL_TABLET | Freq: Four times a day (QID) | ORAL | Status: DC | PRN
  Administered 2019-09-18 – 2019-09-19 (×3): 650 mg via ORAL
  Filled 2019-09-17 (×3): qty 2

## 2019-09-17 MED ORDER — LACTATED RINGERS IV SOLN: INTRAVENOUS | Status: DC | PRN

## 2019-09-17 MED ORDER — SIMETHICONE 80 MG PO CHEW
80.0000 mg | CHEWABLE_TABLET | Freq: Three times a day (TID) | ORAL | Status: DC
  Administered 2019-09-17 – 2019-09-19 (×6): 80 mg via ORAL
  Filled 2019-09-17 (×6): qty 1

## 2019-09-17 MED ORDER — OXYTOCIN 40 UNITS IN NORMAL SALINE INFUSION - SIMPLE MED
INTRAVENOUS | Status: DC | PRN
  Administered 2019-09-17: 40 [IU] via INTRAVENOUS

## 2019-09-17 MED ORDER — SODIUM BICARBONATE 8.4 % IV SOLN
INTRAVENOUS | Status: AC
  Filled 2019-09-17: qty 50

## 2019-09-17 MED ORDER — FENTANYL CITRATE (PF) 100 MCG/2ML IJ SOLN: INTRAMUSCULAR | Status: DC | PRN

## 2019-09-17 MED ORDER — SENNOSIDES-DOCUSATE SODIUM 8.6-50 MG PO TABS
2.0000 | ORAL_TABLET | ORAL | Status: DC
  Administered 2019-09-17 – 2019-09-18 (×2): 2 via ORAL
  Filled 2019-09-17 (×2): qty 2

## 2019-09-17 MED ORDER — DIPHENHYDRAMINE HCL 25 MG PO CAPS: 25.0000 mg | ORAL_CAPSULE | ORAL | Status: DC | PRN

## 2019-09-17 MED ORDER — PRENATAL MULTIVITAMIN CH
1.0000 | ORAL_TABLET | Freq: Every day | ORAL | Status: DC
  Administered 2019-09-17 – 2019-09-18 (×2): 1 via ORAL
  Filled 2019-09-17 (×2): qty 1

## 2019-09-17 MED ORDER — NALBUPHINE HCL 10 MG/ML IJ SOLN: 5.0000 mg | INTRAMUSCULAR | Status: DC | PRN

## 2019-09-17 MED ORDER — IBUPROFEN 600 MG PO TABS
600.0000 mg | ORAL_TABLET | Freq: Four times a day (QID) | ORAL | Status: DC | PRN
  Administered 2019-09-17 – 2019-09-19 (×8): 600 mg via ORAL
  Filled 2019-09-17 (×8): qty 1

## 2019-09-17 MED ORDER — FENTANYL CITRATE (PF) 100 MCG/2ML IJ SOLN
INTRAMUSCULAR | Status: AC
  Filled 2019-09-17: qty 2

## 2019-09-17 MED ORDER — OXYCODONE-ACETAMINOPHEN 5-325 MG PO TABS: 1.0000 | ORAL_TABLET | ORAL | Status: DC | PRN

## 2019-09-17 MED ORDER — ALBUMIN HUMAN 5 % IV SOLN: INTRAVENOUS | Status: DC | PRN

## 2019-09-17 MED ORDER — METHYLERGONOVINE MALEATE 0.2 MG/ML IJ SOLN: 0.2000 mg | INTRAMUSCULAR | Status: DC | PRN

## 2019-09-17 MED ORDER — LACTATED RINGERS IV SOLN: INTRAVENOUS | Status: DC

## 2019-09-17 MED ORDER — HYDROMORPHONE HCL 1 MG/ML IJ SOLN: 0.2500 mg | INTRAMUSCULAR | Status: DC | PRN

## 2019-09-17 MED ORDER — DIPHENHYDRAMINE HCL 50 MG/ML IJ SOLN: 12.5000 mg | INTRAMUSCULAR | Status: DC | PRN

## 2019-09-17 MED ORDER — FENTANYL CITRATE (PF) 100 MCG/2ML IJ SOLN
INTRAMUSCULAR | Status: DC | PRN
  Administered 2019-09-17: 100 ug via EPIDURAL

## 2019-09-17 MED ORDER — SODIUM CHLORIDE 0.9 % IV SOLN: INTRAVENOUS | Status: DC | PRN

## 2019-09-17 MED ORDER — PHENYLEPHRINE HCL (PRESSORS) 10 MG/ML IV SOLN
INTRAVENOUS | Status: DC | PRN
  Administered 2019-09-16 – 2019-09-17 (×5): 80 ug via INTRAVENOUS
  Administered 2019-09-17: 40 ug via INTRAVENOUS

## 2019-09-17 MED ORDER — METOCLOPRAMIDE HCL 5 MG/ML IJ SOLN
INTRAMUSCULAR | Status: DC | PRN
  Administered 2019-09-17: 10 mg via INTRAVENOUS

## 2019-09-17 MED ORDER — WITCH HAZEL-GLYCERIN EX PADS: 1.0000 "application " | MEDICATED_PAD | CUTANEOUS | Status: DC | PRN

## 2019-09-17 MED ORDER — DIBUCAINE (PERIANAL) 1 % EX OINT: 1.0000 "application " | TOPICAL_OINTMENT | CUTANEOUS | Status: DC | PRN

## 2019-09-17 MED ORDER — STERILE WATER FOR IRRIGATION IR SOLN
Status: DC | PRN
  Administered 2019-09-17: 1000 mL

## 2019-09-17 MED ORDER — MORPHINE SULFATE (PF) 0.5 MG/ML IJ SOLN
INTRAMUSCULAR | Status: AC
  Filled 2019-09-17: qty 10

## 2019-09-17 MED ORDER — OXYCODONE HCL 5 MG PO TABS: 5.0000 mg | ORAL_TABLET | Freq: Once | ORAL | Status: DC | PRN

## 2019-09-17 MED ORDER — COCONUT OIL OIL: 1.0000 "application " | TOPICAL_OIL | Status: DC | PRN

## 2019-09-17 MED ORDER — SODIUM CHLORIDE 0.9% FLUSH: 3.0000 mL | INTRAVENOUS | Status: DC | PRN

## 2019-09-17 MED ORDER — DEXAMETHASONE SODIUM PHOSPHATE 10 MG/ML IJ SOLN
INTRAMUSCULAR | Status: DC | PRN
  Administered 2019-09-17: 10 mg via INTRAVENOUS

## 2019-09-17 MED ORDER — TETANUS-DIPHTH-ACELL PERTUSSIS 5-2.5-18.5 LF-MCG/0.5 IM SUSP: 0.5000 mL | Freq: Once | INTRAMUSCULAR | Status: DC

## 2019-09-17 MED ORDER — DIPHENHYDRAMINE HCL 25 MG PO CAPS: 25.0000 mg | ORAL_CAPSULE | Freq: Four times a day (QID) | ORAL | Status: DC | PRN

## 2019-09-17 MED ORDER — CEFAZOLIN SODIUM-DEXTROSE 2-3 GM-%(50ML) IV SOLR
INTRAVENOUS | Status: DC | PRN
  Administered 2019-09-17: 2 g via INTRAVENOUS

## 2019-09-17 SURGICAL SUPPLY — 34 items
ADH SKN CLS APL DERMABOND .7 (GAUZE/BANDAGES/DRESSINGS) ×1
CHLORAPREP W/TINT 26ML (MISCELLANEOUS) ×3 IMPLANT
CLAMP CORD UMBIL (MISCELLANEOUS) IMPLANT
CLOTH BEACON ORANGE TIMEOUT ST (SAFETY) ×3 IMPLANT
DERMABOND ADVANCED (GAUZE/BANDAGES/DRESSINGS) ×2
DERMABOND ADVANCED .7 DNX12 (GAUZE/BANDAGES/DRESSINGS) IMPLANT
DRSG OPSITE POSTOP 4X10 (GAUZE/BANDAGES/DRESSINGS) ×3 IMPLANT
ELECT REM PT RETURN 9FT ADLT (ELECTROSURGICAL) ×3
ELECTRODE REM PT RTRN 9FT ADLT (ELECTROSURGICAL) ×1 IMPLANT
EXTRACTOR VACUUM M CUP 4 TUBE (SUCTIONS) IMPLANT
EXTRACTOR VACUUM M CUP 4' TUBE (SUCTIONS)
GLOVE BIO SURGEON STRL SZ7 (GLOVE) ×3 IMPLANT
GLOVE BIOGEL PI IND STRL 7.0 (GLOVE) ×1 IMPLANT
GLOVE BIOGEL PI INDICATOR 7.0 (GLOVE) ×2
GOWN STRL REUS W/TWL LRG LVL3 (GOWN DISPOSABLE) ×6 IMPLANT
KIT ABG SYR 3ML LUER SLIP (SYRINGE) IMPLANT
NDL HYPO 25X5/8 SAFETYGLIDE (NEEDLE) IMPLANT
NEEDLE HYPO 22GX1.5 SAFETY (NEEDLE) IMPLANT
NEEDLE HYPO 25X5/8 SAFETYGLIDE (NEEDLE) IMPLANT
NS IRRIG 1000ML POUR BTL (IV SOLUTION) ×3 IMPLANT
PACK C SECTION WH (CUSTOM PROCEDURE TRAY) ×3 IMPLANT
PAD OB MATERNITY 4.3X12.25 (PERSONAL CARE ITEMS) ×3 IMPLANT
PENCIL SMOKE EVAC W/HOLSTER (ELECTROSURGICAL) ×3 IMPLANT
RTRCTR C-SECT PINK 25CM LRG (MISCELLANEOUS) ×3 IMPLANT
SUT CHROMIC 1 CTX 36 (SUTURE) ×10 IMPLANT
SUT CHROMIC 2 0 CT 1 (SUTURE) ×5 IMPLANT
SUT PDS AB 0 CTX 60 (SUTURE) ×3 IMPLANT
SUT VIC AB 2-0 CT1 27 (SUTURE) ×3
SUT VIC AB 2-0 CT1 TAPERPNT 27 (SUTURE) ×1 IMPLANT
SUT VIC AB 4-0 KS 27 (SUTURE) IMPLANT
SYR 30ML LL (SYRINGE) IMPLANT
TOWEL OR 17X24 6PK STRL BLUE (TOWEL DISPOSABLE) ×3 IMPLANT
TRAY FOLEY W/BAG SLVR 14FR LF (SET/KITS/TRAYS/PACK) ×3 IMPLANT
WATER STERILE IRR 1000ML POUR (IV SOLUTION) ×3 IMPLANT

## 2019-09-17 NOTE — Progress Notes (Signed)
Patient is tolerating oral intake.  Pain control is good.  Not yet voided or flatus. Appropriate lochia.  No complaints.  Vitals:   09/17/19 0430 09/17/19 0532 09/17/19 0627 09/17/19 0745  BP: 113/70 117/68 116/67 109/63  Pulse: 79 72 69 92  Resp: 16 18 18 17   Temp: 99.2 F (37.3 C) 97.8 F (36.6 C) 98.2 F (36.8 C) 98.2 F (36.8 C)  TempSrc: Oral Oral Oral Oral  SpO2: 99%   97%  Weight:      Height:        Fundus firm Abd: soft, nontender Inc: c/d/i Ext: no calf tenderness  Lab Results  Component Value Date   WBC 14.2 (H) 09/17/2019   HGB 8.2 (L) 09/17/2019   HCT 25.6 (L) 09/17/2019   MCV 89.5 09/17/2019   PLT 173 09/17/2019    --/--/A POS (04/12 0044)  A/P Post op day #0 s/p c/s Postop anemia- iron bid Circ desired, baby has not yet voided. Other routine care.  Routine care.    09-11-1973

## 2019-09-17 NOTE — Anesthesia Postprocedure Evaluation (Signed)
Anesthesia Post Note  Patient: Felicia Klein  Procedure(s) Performed: CESAREAN SECTION (N/A )     Patient location during evaluation: Mother Baby Anesthesia Type: Epidural Level of consciousness: oriented and awake and alert Pain management: pain level controlled Vital Signs Assessment: post-procedure vital signs reviewed and stable Respiratory status: spontaneous breathing and respiratory function stable Cardiovascular status: blood pressure returned to baseline and stable Postop Assessment: no headache, no backache, no apparent nausea or vomiting and able to ambulate Anesthetic complications: no    Last Vitals:  Vitals:   09/17/19 0400 09/17/19 0407  BP: 95/69 111/63  Pulse: 96 79  Resp: (!) 22 14  Temp: 36.6 C   SpO2: 100% 99%    Last Pain:  Vitals:   09/17/19 0400  TempSrc:   PainSc: 4    Pain Goal:    LLE Motor Response: Purposeful movement (09/17/19 0400) LLE Sensation: Tingling (09/17/19 0400) RLE Motor Response: Purposeful movement (09/17/19 0400) RLE Sensation: Tingling (09/17/19 0400)     Epidural/Spinal Function Cutaneous sensation: Able to Wiggle Toes (09/17/19 0400), Patient able to flex knees: Yes (09/17/19 0400), Patient able to lift hips off bed: Yes (09/17/19 0400), Back pain beyond tenderness at insertion site: No (09/17/19 0400), Progressively worsening motor and/or sensory loss: No (09/17/19 0400), Bowel and/or bladder incontinence post epidural: No (09/17/19 0400)  Trevor Iha

## 2019-09-17 NOTE — Progress Notes (Signed)
Patient ID: Felicia Klein, female   DOB: January 01, 1991, 29 y.o.   MRN: [id number]  S: Comfortable with epidural O:  Vitals:   09/17/19 0000 09/17/19 0030 09/17/19 0035 09/17/19 0100  BP: 118/62 120/69  127/74  Pulse: 61 79  75  Resp: 17     Temp:   99.2 F (37.3 C)   TempSrc:   Oral   SpO2:      Weight:      Height:       AOx3, NAD FHR 130 reassuring cvx posterior rim/c/0. Cephalic OP presentation. Rotation attempted to no avail  Pt has been rim for 3 hours. Prior to that she had protracted labor for a multipara. IUPC placed @ 1900 and patient has been in adequate labor since placement. Discussed findings with patient and partner. Given protracted labor in a patient with a pelvis proven to 8 pounds the concern for cephalopelvic disproportion is significant. Discussed options of continuing trial of labor versus proceeding with cesarean section. R/B/A of both options reviewed. Pt wishes to proceed with cesarean section

## 2019-09-17 NOTE — Transfer of Care (Signed)
Immediate Anesthesia Transfer of Care Note  Patient: Felicia Klein  Procedure(s) Performed: CESAREAN SECTION (N/A )  Patient Location: PACU  Anesthesia Type:Epidural  Level of Consciousness: awake, alert  and oriented  Airway & Oxygen Therapy: Patient Spontanous Breathing  Post-op Assessment: Report given to RN  Post vital signs: Reviewed and stable  Last Vitals:  Vitals Value Taken Time  BP 101/51 09/17/19 0302  Temp 36.6 C 09/17/19 0302  Pulse 77 09/17/19 0314  Resp 14 09/17/19 0314  SpO2 100 % 09/17/19 0314  Vitals shown include unvalidated device data.  Last Pain:  Vitals:   09/17/19 0302  TempSrc:   PainSc: 3          Complications: No apparent anesthesia complications

## 2019-09-17 NOTE — Op Note (Signed)
Pre-Operative Diagnosis: 1) 40+4 week intrauterine pregnancy 2) Failure to progress Postoperative Diagnosis: 1) 40+4 week intrauterine pregnancy 2) Failure to progress Procedure: Primary low transverse cesarean section Surgeon: Dr. Waynard Reeds Assistant: none Operative Findings: Vigorous female infant in the vertex presentation with Apgar scores of 8 at 1 minute 9 at 5 minutes.  Weight pending. Specimen: Placenta to pathology EBL: Total I/O In: 2250 [I.V.:2000; IV Piggyback:250] Out: 1221 [Urine:275; Blood:946]   Procedure:Felicia Klein is an 29 year old gravida 3 para 1011 at 40 weeks and 4 days estimated gestational age who presents for cesarean section. Following the appropriate informed consent the patient was brought to the operating room where spinal anesthesia was administered and found to be adequate. She was placed in the dorsal supine position with a leftward tilt. She was prepped and draped in the normal sterile fashion. Scalpel was then used to make a Pfannenstiel skin incision which was carried down to the underlying layers of soft tissue to the fascia. The fascia was incised in the midline and the fascial incision was extended laterally with Mayo scissors. The superior aspect of the fascial incision was grasped with Coker clamps x2, tented up and the rectus muscles dissected off sharply with the electrocautery unit area and the same procedure was repeated on the inferior aspect of the fascial incision. The rectus muscles were separated in the midline. The abdominal peritoneum was identified, tented up, entered sharply, and the incision was extended superiorly and inferiorly with good visualization of the bladder. The Alexis retractor was then deployed. The vesicouterine peritoneum was identified, tented up, entered sharply, and the bladder flap was created digitally. Scalpel was then used to make a low transverse incision on the uterus which was extended laterally with both blunt dissection. The  fetal vertex was identified, delivered easily through the uterine incision followed by the body. The infant was bulb suctioned on the operative field.  After a 1 minute delay in cord clamping, the infant was passed to the waiting neonatology team. Placenta was then delivered spontaneously, the uterus was cleared of all clot and debris. The uterine incision was repaired with #1 chromic in running locked fashion followed by a second imbricating layer. Ovaries and tubes were inspected and normal. The Alexis retractor was removed. The uterus was returned to the abdominal cavity the abdominal cavity was cleared of all clot and debris. The abdominal peritoneum was reapproximated with 2-0 Vicryl in a running fashion, the rectus muscles was reapproximated with #1 chromic in a running fashion. The fascia was closed with a looped PDS in a running fashion. The skin was closed with 4-0 vicryl in a subcuticular fashion and Dermabond. All sponge lap and needle counts were correct. Patient tolerated the procedure well and recovered in stable condition following the procedure.

## 2019-09-17 NOTE — Lactation Note (Signed)
This note was copied from a baby's chart. Lactation Consultation Note  Patient Name: Felicia Klein [id number] Date: 09/17/2019 Reason for consult: Initial assessment;Term P2, 3 hour term LGA female infant. Per mom, she breastfed her 28 year old for 4 months. Per mom, she feels infant is latching well she has no concerns at this time, infant breastfed for 20 minutes in L&D. LC did not observe latch at this time. Mom was doing STS with infant when [facility] entered the room. Infant had one void and one stool. Per mom, she brought her DEBP from home. Mom knows to breastfeed according hunger cues, on demand and not exceed 3 hours without breastfeeding infant. Mom knows to call RN or LC if she has any questions, concerns or needs assistance with latching infant at breast.  Mom made aware of O/P services, breastfeeding support groups, community resources, and our phone # for post-discharge questions.   Maternal Data    Feeding Feeding Type: Breast Fed  LATCH Score Latch: Repeated attempts needed to sustain latch, nipple held in mouth throughout feeding, stimulation needed to elicit sucking reflex.  Audible Swallowing: A few with stimulation  Type of Nipple: Everted at rest and after stimulation  Comfort (Breast/Nipple): Soft / non-tender  Hold (Positioning): Assistance needed to correctly position infant at breast and maintain latch.  LATCH Score: 7  Interventions Interventions: Breast feeding basics reviewed;DEBP;Skin to skin;Hand express;Expressed milk  Lactation Tools Discussed/Used     Consult Status Consult Status: Follow-up Date: 09/17/19 Follow-up type: In-patient    Danelle Earthly 09/17/2019, 6:53 AM

## 2019-09-18 LAB — SURGICAL PATHOLOGY

## 2019-09-18 NOTE — Lactation Note (Signed)
This note was copied from a baby's chart. Lactation Consultation Note  Patient Name: Felicia Klein [id number] Date: 09/18/2019 Reason for consult: Follow-up assessment Baby is 35 hours old/6% weight loss.  Mom is currently pumping with the manual pump and has obtained about 10 mls.  Baby is sleeping in the crib.  Mom reports that baby cluster fed last night.  She feels baby is latching well.  She will spoon feed colostrum after latching to breast.  Encouraged to call for assist prn.  Maternal Data    Feeding Feeding Type: Breast Fed  LATCH Score                   Interventions Interventions: Hand pump  Lactation Tools Discussed/Used     Consult Status Consult Status: Follow-up Date: 09/19/19 Follow-up type: In-patient    Huston Foley 09/18/2019, 2:03 PM

## 2019-09-18 NOTE — Progress Notes (Signed)
Subjective: Postpartum Day 1: Cesarean Delivery Patient reports tolerating PO and no problems voiding. Appropriate incisional pain.  Objective: Patient Vitals for the past 24 hrs:  BP Temp Temp src Pulse Resp SpO2  09/18/19 0419 (!) 105/53 97.6 F (36.4 C) Oral 86 16 98 %  09/17/19 2330 (!) 116/59 98 F (36.7 C) Oral 78 17 98 %  09/17/19 1925 117/60 98 F (36.7 C) Oral 76 17 100 %  09/17/19 1534 (!) 112/58 97.7 F (36.5 C) Oral 78 18 99 %  09/17/19 1210 -- 97.8 F (36.6 C) Oral -- 16 97 %    Physical Exam:  General: alert Lochia: appropriate Uterine Fundus: firm Incision: dressing clean and dry DVT Evaluation: No evidence of DVT seen on physical exam.  Recent Labs    09/16/19 0044 09/17/19 0536  HGB 11.6* 8.2*  HCT 35.6* 25.6*    Assessment/Plan: Status post Cesarean section. Doing well postoperatively.  Continue current care. Felicia Klein 28 y.o. [id number] at [redacted]w[redacted]d POD#1 sp CS due to arrest of dilation 1. POC: EBL 946cc, Hgb 11.6>8.2, discharge with PO iron 2. Obesity BMI 30 3. Desires neonatal circumcision, R/B/A of procedure discussed at length. Pt understands that neonatal circumcision is not considered medically necessary and is elective. The risks include, but are not limited to bleeding, infection, damage to the penis, development of scar tissue, and having to have it redone at a later date. Pt understands theses risks and wishes to proceed Rubella immune, blood type A+, breastfeeding, baby boy  Felicia Klein 09/18/2019, 8:14 AM

## 2019-09-19 MED ORDER — FERROUS FUMARATE 324 (106 FE) MG PO TABS: 1.0000 | ORAL_TABLET | Freq: Two times a day (BID) | ORAL | 0 refills | Status: AC

## 2019-09-19 MED ORDER — IBUPROFEN 600 MG PO TABS: 600.0000 mg | ORAL_TABLET | Freq: Four times a day (QID) | ORAL | 0 refills | Status: DC | PRN

## 2019-09-19 NOTE — Progress Notes (Signed)
POD#2 Pt is in the shower. Partner states that she is ready for discharge. VSSAF HGB -8.2 IMP/ stable Plan/ Will discharge to home.

## 2019-09-19 NOTE — Discharge Summary (Signed)
Obstetric Discharge Summary Reason for Admission: induction of labor Prenatal Procedures: NST Intrapartum Procedures: cesarean: low cervical, transverse Postpartum Procedures: none Complications-Operative and Postpartum: anemia Hemoglobin  Date Value Ref Range Status  09/17/2019 8.2 (L) 12.0 - 15.0 g/dL Final    Comment:    REPEATED TO VERIFY   HCT  Date Value Ref Range Status  09/17/2019 25.6 (L) 36.0 - 46.0 % Final    Physical Exam:  General: alert and cooperative Lochia: appropriate  Discharge Diagnoses: Post-date pregnancy and failure to progress  Discharge Information: Date: 09/19/2019 Activity: pelvic rest Diet: routine Medications: PNV, Ibuprofen and Iron Condition: stable Instructions: refer to practice specific booklet Discharge to: home Follow-up Information    Waynard Reeds, MD. Schedule an appointment as soon as possible for a visit in 1 month(s).   Specialty: Obstetrics and Gynecology Contact information: [address] [place] [address] [phone]           Newborn Data: Live born female  Birth Weight: 9 lb 7 oz (4280 g) APGAR: 8, 9  Newborn Delivery   Birth date/time: 09/17/2019 02:11:00 Delivery type: C-Section, Low Transverse Trial of labor: No C-section categorization: Primary      Home with mother.  Marlow Hendrie E 09/19/2019, 10:24 AM

## 2019-09-19 NOTE — Lactation Note (Signed)
This note was copied from a baby's chart. Lactation Consultation Note  Patient Name: Felicia Klein [id number] Date: 09/19/2019 Reason for consult: Follow-up assessment  P2 mother whose infant is now 51 hours old.  This is a term baby at 40+4 weeks with a 9% weight loss.   Nursing students assessing baby when I arrived.  He had an episode of spitting during his assessment.  Students assisted baby and parents calmly observed.  Baby calmed easily after episode.  Mother stated that baby has been latching and feeding well.  She denies pain with latching.  He cluster fed last night.  Mother is able to obtain colostrum easily and has been feeding back any EBM she obtains to baby.  Encouraged to continue feeding at 8-12 times/24 hours or more if baby desires.    Engorgement prevention/treatment reviewed.  Mother has a manual pump and a DEBP for home use.  She has her own nipple cream to use as needed, but, has not had to use it at all.  Suggested mother call for any further questions/concerns prior to discharge today.     Maternal Data    Feeding Feeding Type: Breast Fed  LATCH Score                   Interventions    Lactation Tools Discussed/Used     Consult Status Consult Status: Complete Date: 09/19/19 Follow-up type: Call as needed    [name] R [name] 09/19/2019, 8:27 AM

## 2021-03-18 IMAGING — US OBSTETRIC <14 WK ULTRASOUND
1 series · 15 of 28 positions shown · non-contrast
Comparison: None.

CLINICAL DATA: 8 week IUP seen on previous outside ultrasound.
Heavy vaginal bleeding for several days.

EXAM:
OBSTETRIC <14 WK ULTRASOUND
TECHNIQUE: Transabdominal ultrasound was performed for evaluation of the
gestation as well as the maternal uterus and adnexal regions.

[Series 1: obstetric <14 wk ultrasound · 15 of 33 slices shown]
[im 1/33]
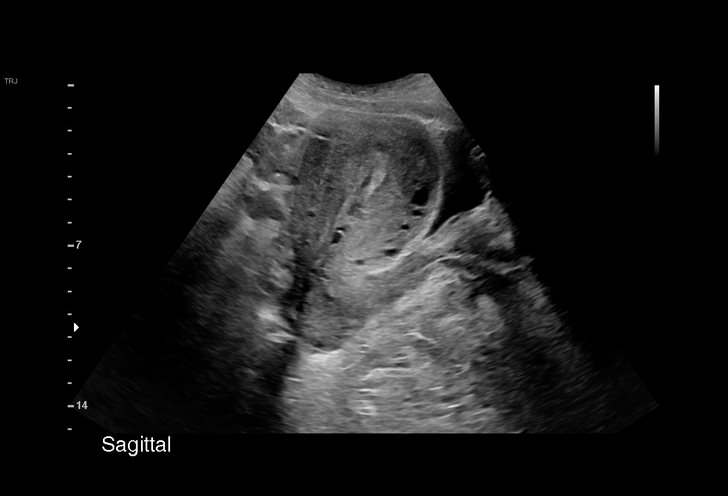
[im 3/33]
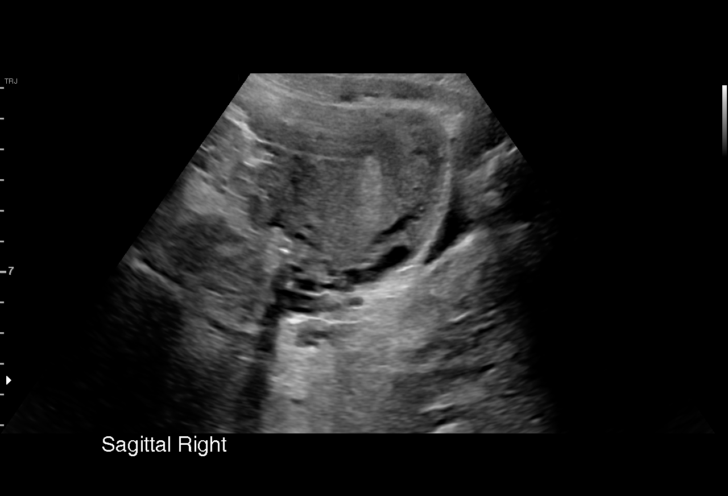
[im 5/33]
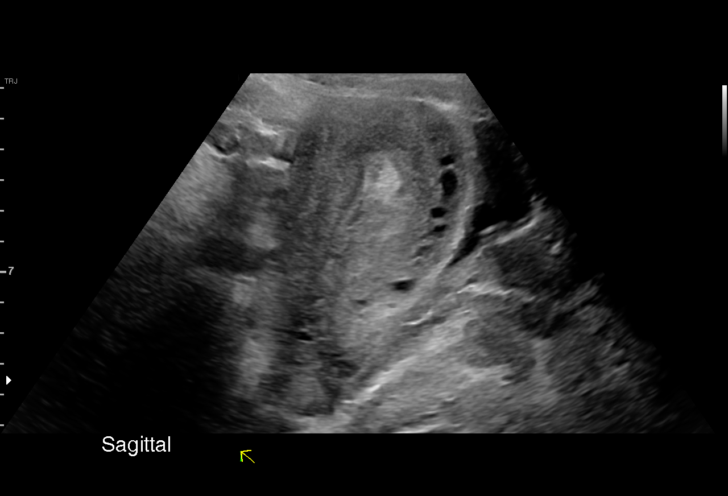
[im 8/33]
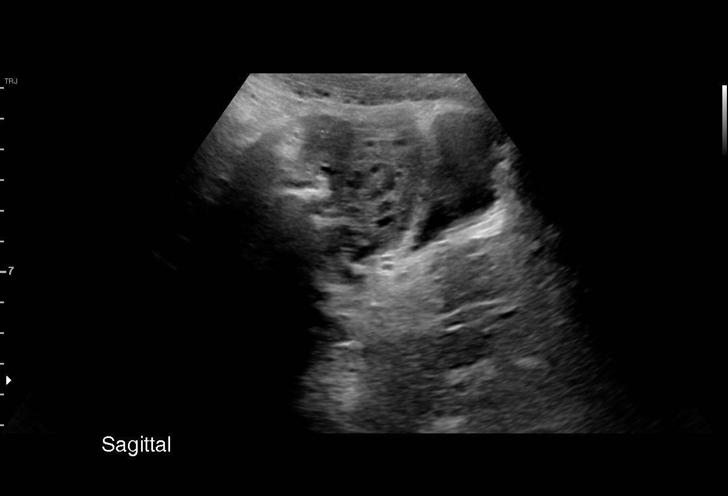
[im 10/33]
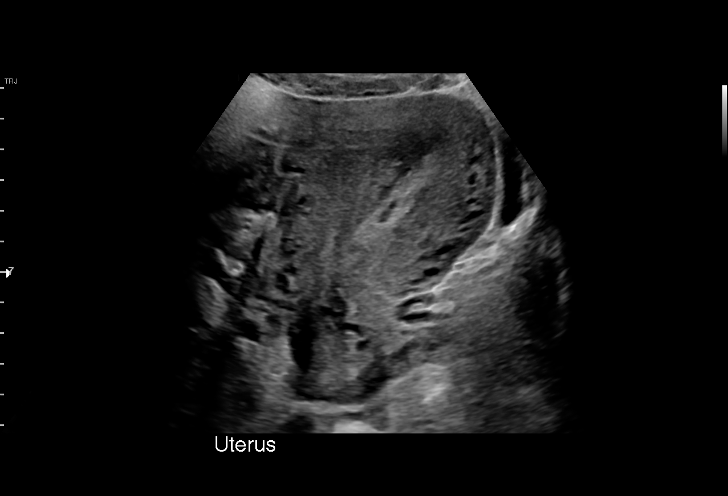
[im 12/33]
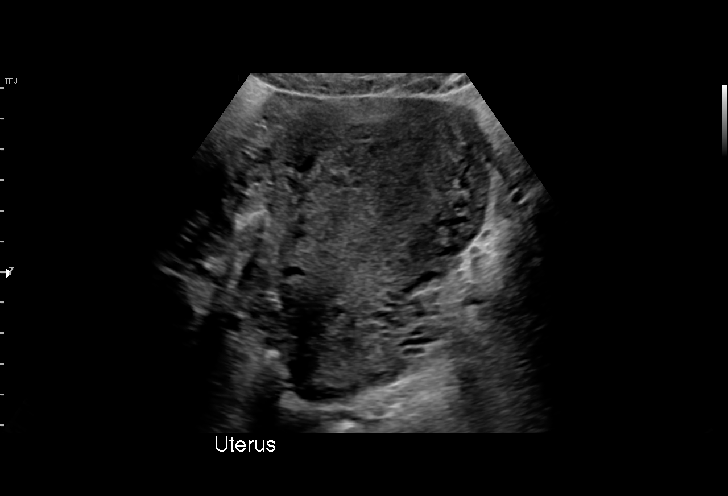
[im 15/33]
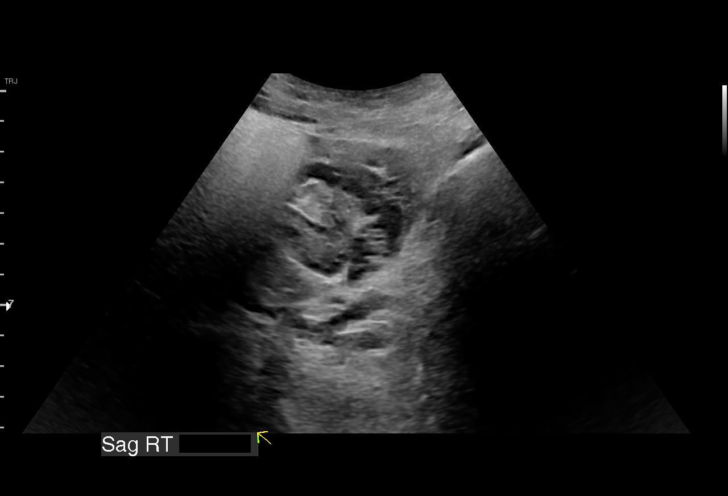
[im 17/33]
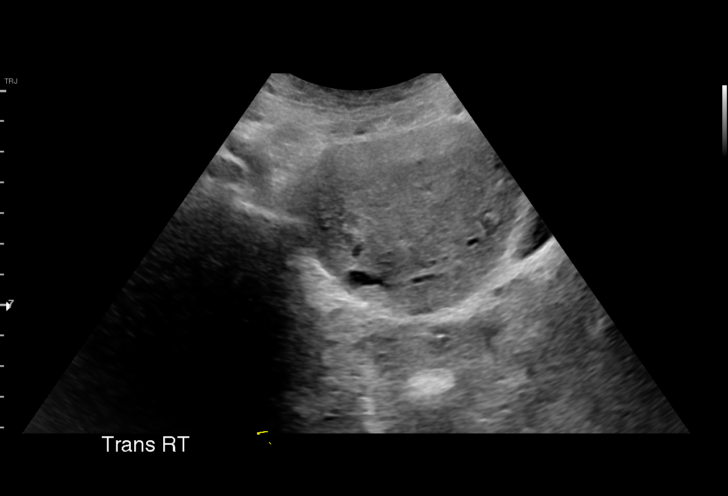
[im 18/33]
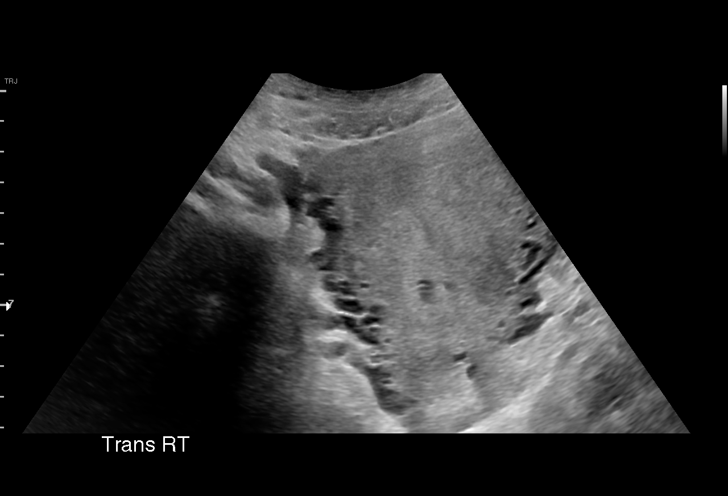
[im 21/33]
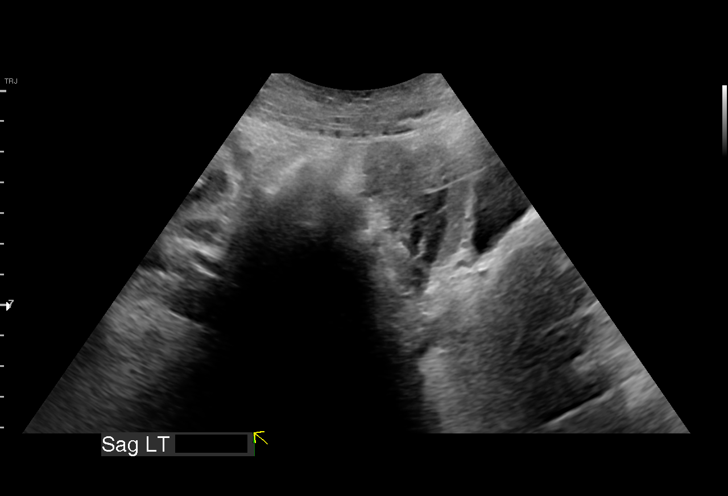
[im 23/33]
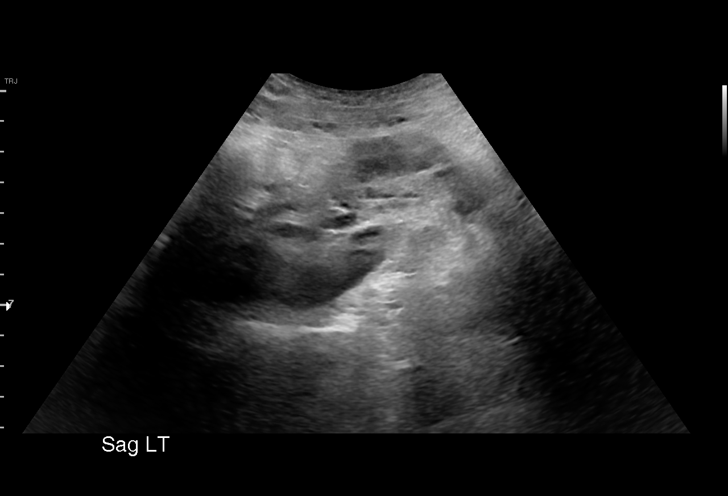
[im 25/33]
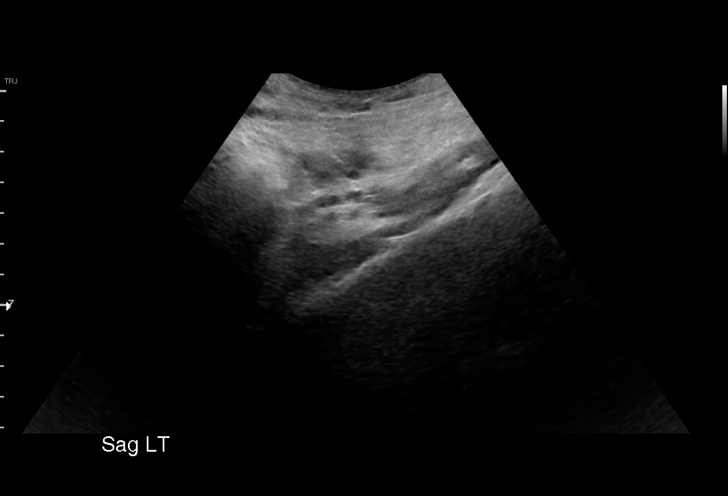
[im 28/33]
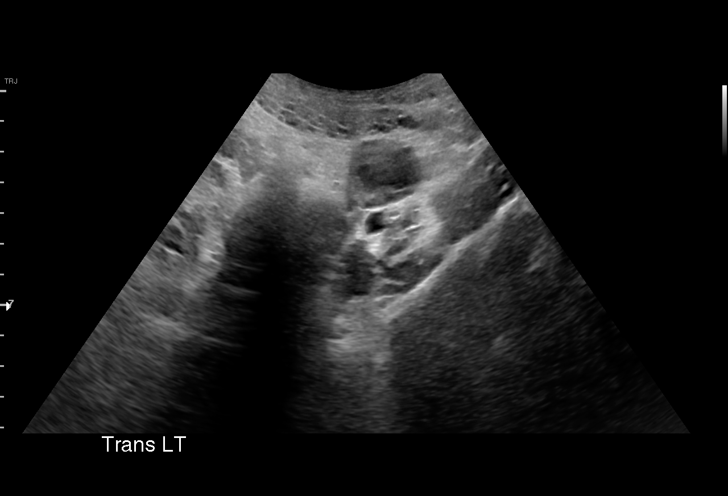
[im 30/33]
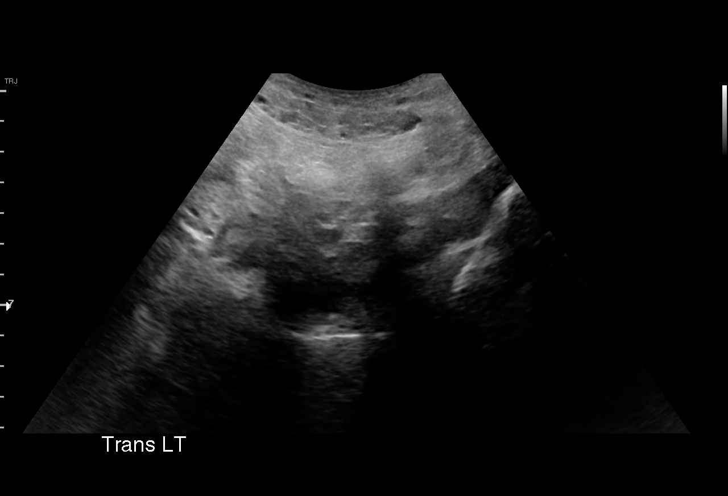
[im 33/33]
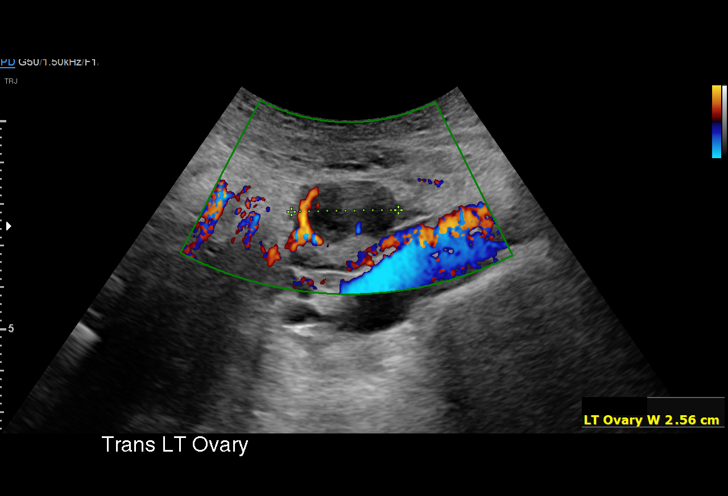

[15 of 28 positions shown; findings below may reference images not displayed]

FINDINGS: Intrauterine gestational sac: None

Maternal uterus/adnexae: Tiny amount of fluid noted in endometrial
cavity, however no gestational sac identified. The left ovary is
normal appearance. Right ovary not directly visualized, however no
mass or abnormal free fluid identified.
IMPRESSION: No IUP or adnexal mass identified. Given 8 week IUP seen on prior
outside ultrasound, this is consistent with interval spontaneous
abortion.

## 2021-07-13 ENCOUNTER — Other Ambulatory Visit: Payer: Self-pay

## 2021-07-13 ENCOUNTER — Emergency Department (HOSPITAL_BASED_OUTPATIENT_CLINIC_OR_DEPARTMENT_OTHER)
Admission: EM | Admit: 2021-07-13 | Discharge: 2021-07-14 | Disposition: A | Discharge status: Home / Self Care | Payer: Medicaid Other | Attending: Emergency Medicine | Admitting: Emergency Medicine

## 2021-07-13 ENCOUNTER — Encounter (HOSPITAL_BASED_OUTPATIENT_CLINIC_OR_DEPARTMENT_OTHER): Payer: Self-pay

## 2021-07-13 DIAGNOSIS — Z20822 Contact with and (suspected) exposure to covid-19: Secondary | ICD-10-CM | POA: Diagnosis not present

## 2021-07-13 DIAGNOSIS — J029 Acute pharyngitis, unspecified: Secondary | ICD-10-CM | POA: Insufficient documentation

## 2021-07-13 LAB — GROUP A STREP BY PCR: Group A Strep by PCR: NOT DETECTED

## 2021-07-13 LAB — RESP PANEL BY RT-PCR (FLU A&B, COVID) ARPGX2
Influenza A by PCR: NEGATIVE
Influenza B by PCR: NEGATIVE
SARS Coronavirus 2 by RT PCR: NEGATIVE

## 2021-07-13 MED ORDER — DEXAMETHASONE 4 MG PO TABS
10.0000 mg | ORAL_TABLET | Freq: Once | ORAL | Status: AC
  Administered 2021-07-13: 10 mg via ORAL
  Filled 2021-07-13: qty 3

## 2021-07-13 NOTE — ED Triage Notes (Signed)
Sore throat and cough since last Friday 07/09/2021.

## 2021-07-13 NOTE — ED Provider Notes (Signed)
[facility] EMERGENCY DEPT Provider Note   CSN: [id number] Arrival date & time: 07/13/21  2057     History  Chief Complaint  Patient presents with   Sore Throat    Felicia Klein is a 31 y.o. female.  The history is provided by the patient.  Sore Throat She has history of asthma and comes in complaining of a sore throat for the last 4 days.  She did have a slight cough yesterday, but it has resolved.  She denies fever, chills, sweats.  She denies nausea or vomiting.  She denies any sick contacts.   Home Medications Prior to Admission medications   Medication Sig Start Date End Date Taking? Authorizing Provider  Ferrous Fumarate (HEMOCYTE - 106 MG FE) 324 (106 Fe) MG TABS tablet Take 1 tablet (106 mg of iron total) by mouth 2 (two) times daily. 09/19/19   Levi Aland, MD  ibuprofen (ADVIL) 600 MG tablet Take 1 tablet (600 mg total) by mouth every 6 (six) hours as needed for mild pain, moderate pain or cramping. 09/19/19   Levi Aland, MD  Prenatal Vit-Fe Fumarate-FA (PRENATAL MULTIVITAMIN) TABS tablet Take 1 tablet by mouth daily at 12 noon.    [provider]      Allergies    Patient has no known allergies.    Review of Systems   Review of Systems  All other systems reviewed and are negative.  Physical Exam Updated Vital Signs BP 131/79 (BP Location: Right Arm)    Pulse 78    Temp 98.2 F (36.8 C) (Oral)    Resp 16    Ht 5\' 8"  (1.727 m)    Wt 62.1 kg    LMP 07/06/2021 (Approximate)    SpO2 100%    BMI 20.83 kg/m  Physical Exam Vitals and nursing note reviewed.  31 year old female, resting comfortably and in no acute distress. Vital signs are normal. Oxygen saturation is 100%, which is normal. Head is normocephalic and atraumatic. PERRLA, EOMI. Oropharynx shows minimal tonsillar hypertrophy without exudate.  There is no pooling of secretions and phonation is normal. Neck is nontender and supple without adenopathy. Back is nontender and there  is no CVA tenderness. Lungs are clear without rales, wheezes, or rhonchi. Chest is nontender. Heart has regular rate and rhythm without murmur. Abdomen is soft, flat, nontender. Extremities have no cyanosis or edema, full range of motion is present. Skin is warm and dry without rash. Neurologic: Mental status is normal, cranial nerves are intact, moves all extremities equally.  ED Results / Procedures / Treatments   Labs (all labs ordered are listed, but only abnormal results are displayed) Labs Reviewed  RESP PANEL BY RT-PCR (FLU A&B, COVID) ARPGX2  GROUP A STREP BY PCR   Procedures Procedures    Medications Ordered in ED Medications  dexamethasone (DECADRON) tablet 10 mg (has no administration in time range)    ED Course/ Medical Decision Making/ A&P                           Medical Decision Making Risk Prescription drug management.   Pharyngitis-streptococcal versus viral.  Respiratory pathogen panel is negative for COVID-19 and influenza.  Strep PCR is pending.  She is given a dose of dexamethasone.  Old records are reviewed, and she has no relevant past visits.  Strep PCR is negative.  She is discharged with instructions to use over-the-counter lozenges and throat sprays as  well as ibuprofen and acetaminophen.  Return precautions discussed.        Final Clinical Impression(s) / ED Diagnoses Final diagnoses:  Pharyngitis, unspecified etiology    Rx / DC Orders ED Discharge Orders     None         Dione Booze, MD 07/14/21 0007

## 2021-07-14 NOTE — Discharge Instructions (Signed)
Drink plenty of fluids.  Use lozenges, throat sprays, warm salt water gargles as needed.  Take acetaminophen and/or ibuprofen as needed for pain.  Please note that if you take both acetaminophen and ibuprofen, you get better pain relief than you would get from taking either medication by itself.

## 2022-01-17 LAB — OB RESULTS CONSOLE ABO/RH: RH Type: POSITIVE

## 2022-01-17 LAB — OB RESULTS CONSOLE RUBELLA ANTIBODY, IGM: Rubella: IMMUNE

## 2022-01-17 LAB — OB RESULTS CONSOLE ANTIBODY SCREEN: Antibody Screen: NEGATIVE

## 2022-01-17 LAB — OB RESULTS CONSOLE HIV ANTIBODY (ROUTINE TESTING): HIV: NONREACTIVE

## 2022-01-17 LAB — HEPATITIS C ANTIBODY: HCV Ab: NEGATIVE

## 2022-01-17 LAB — OB RESULTS CONSOLE RPR: RPR: NONREACTIVE

## 2022-01-17 LAB — OB RESULTS CONSOLE HEPATITIS B SURFACE ANTIGEN: Hepatitis B Surface Ag: NEGATIVE

## 2022-01-19 LAB — OB RESULTS CONSOLE GC/CHLAMYDIA
Chlamydia: NEGATIVE
Neisseria Gonorrhea: NEGATIVE

## 2022-01-28 IMAGING — US US MFM OB LIMITED
1 series · 15 of 26 positions shown · non-contrast
Comparison: none

[Series 1: us mfm ob limited · 26 acquisitions, 15 frames shown]
[im 1/26]
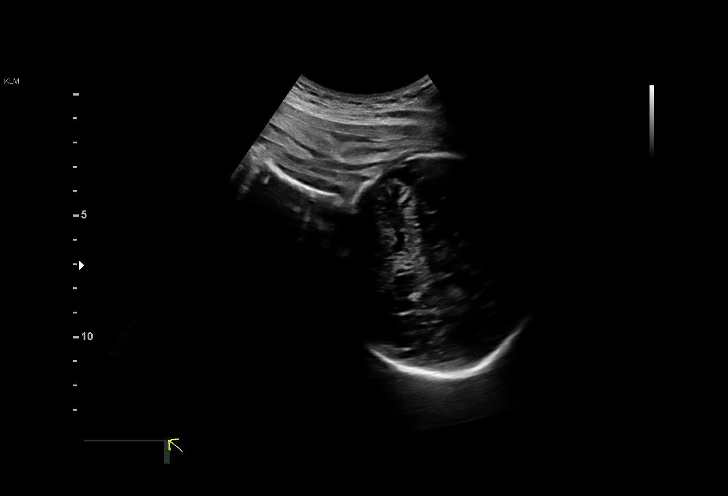
[im 3/26]
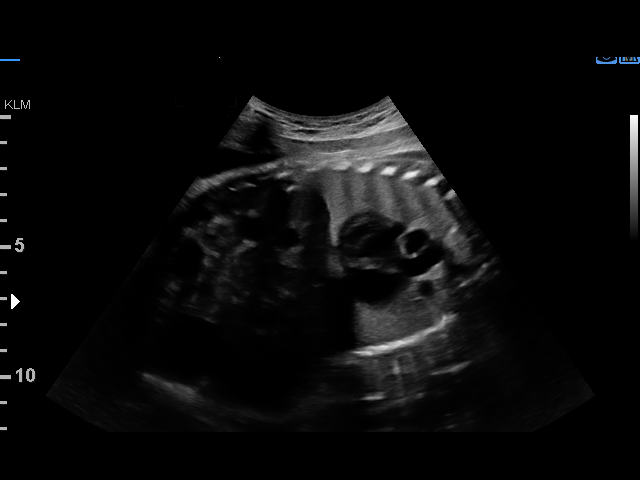
[im 5/26]
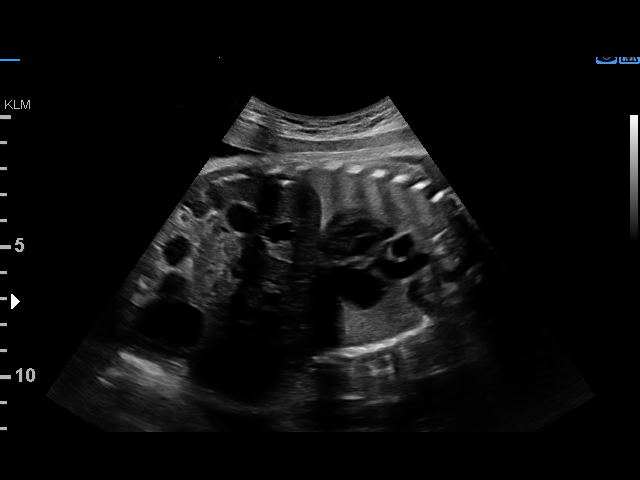
[im 7/26]
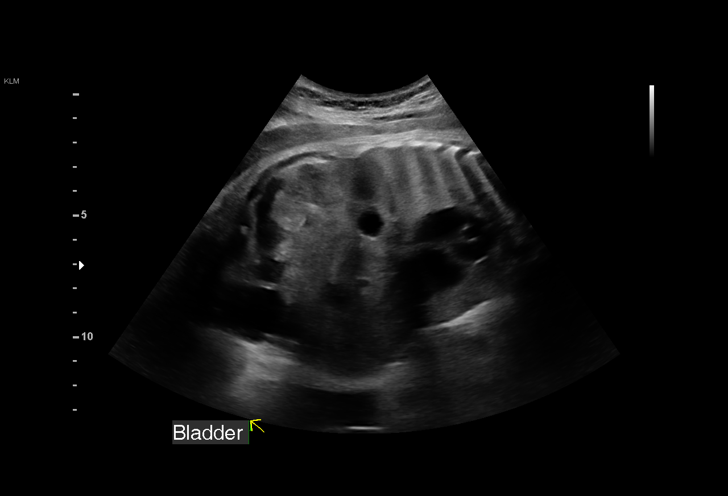
[im 8/26]
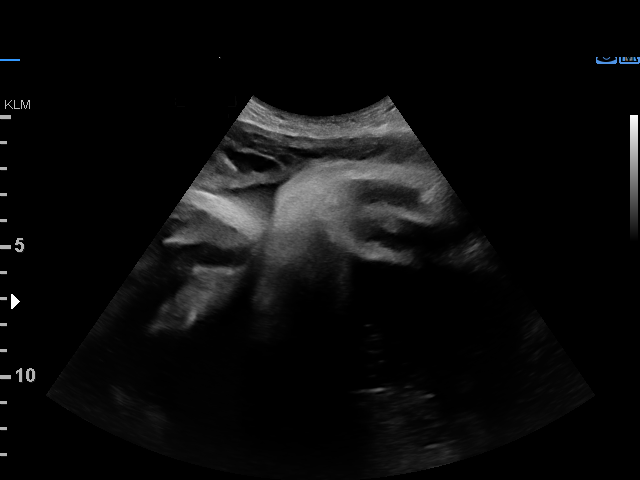
[im 10/26]
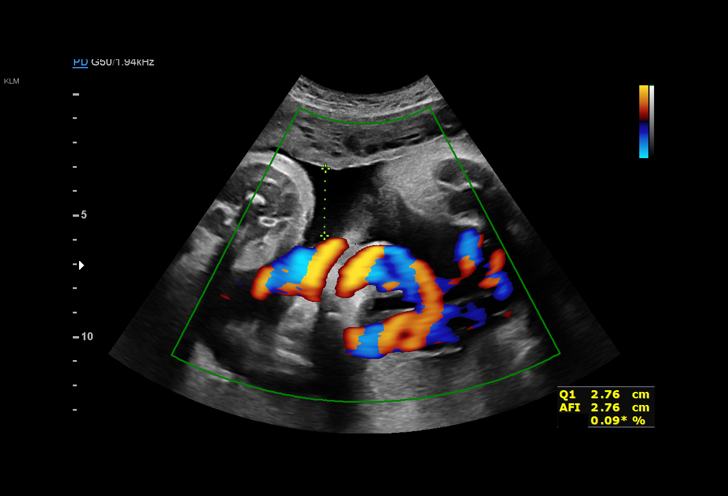
[im 12/26]
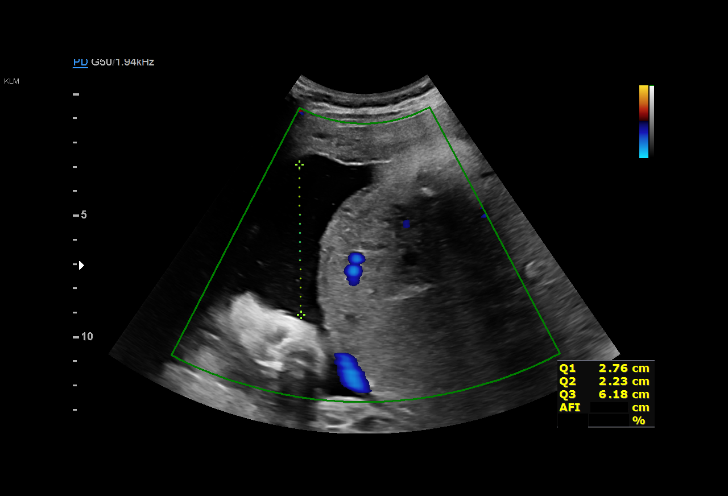
[im 14/26]
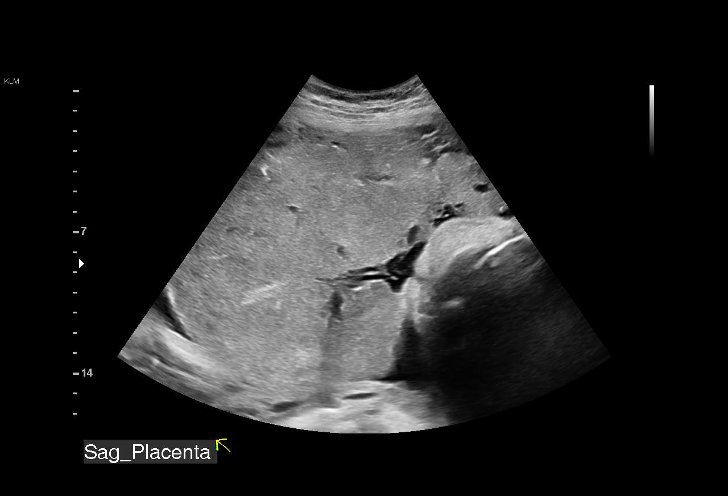
[im 15/26]
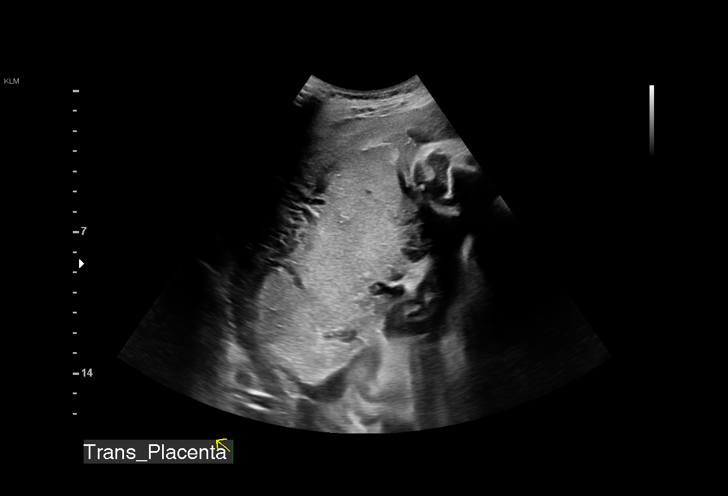
[im 17/26]
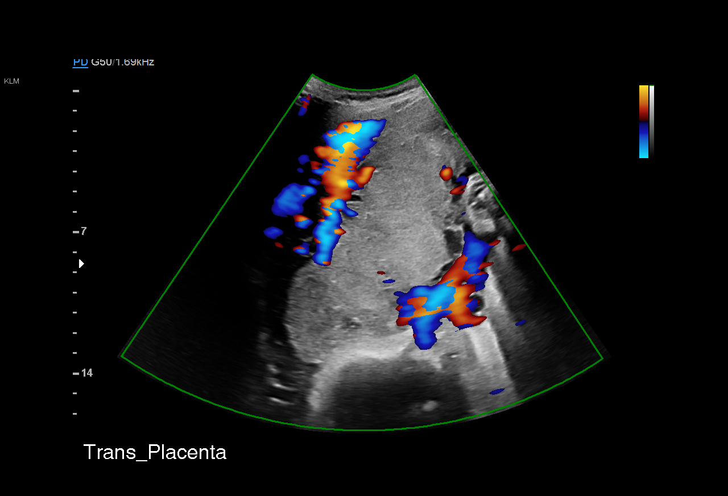
[im 19/26]
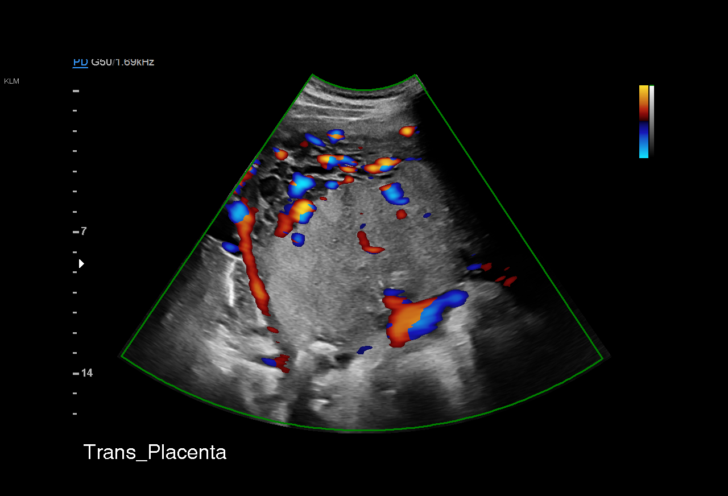
[im 20/26]
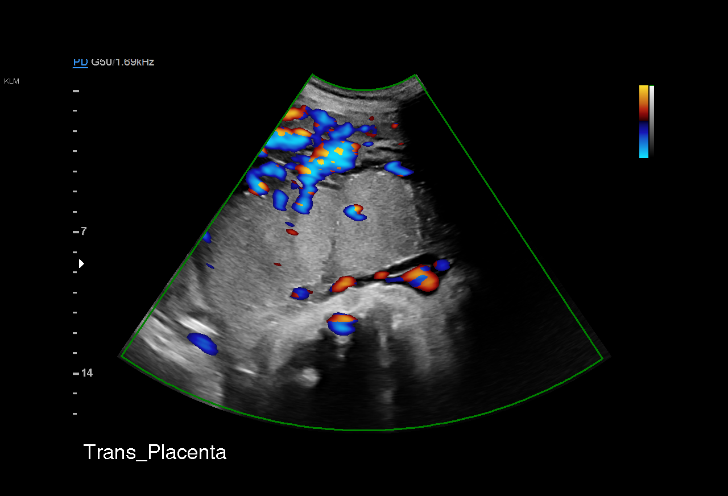
[im 22/26]
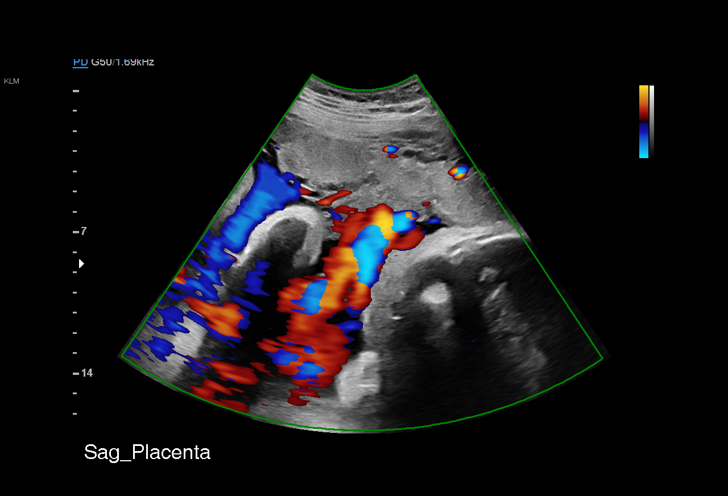
[im 24/26]
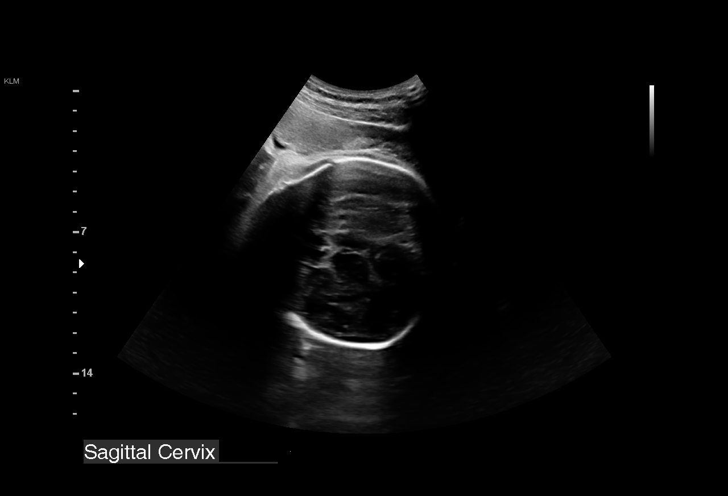
[im 26/26]
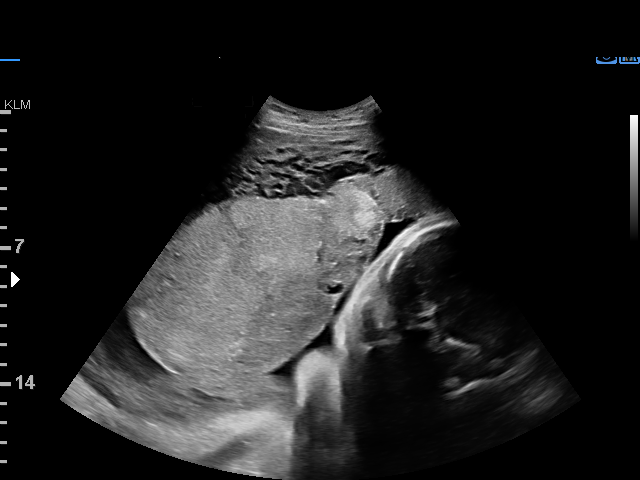

[15 of 26 positions shown; findings below may reference images not displayed]

Road; [HOSPITAL]
 Attending:        Kanchi Font        Secondary Phy.:   [facility]
                                                            Care
                   [facility]                              [HOSPITAL]

  1  US MFM OB LIMITED                    76815.01     [facility]
 ----------------------------------------------------------------------

 ----------------------------------------------------------------------
Indications

  Traumatic injury during pregnancy (fall)
  33 weeks gestation of pregnancy
 ----------------------------------------------------------------------
Fetal Evaluation

 Num Of Fetuses:         1
 Fetal Heart Rate(bpm):  144
 Cardiac Activity:       Observed
 Presentation:           Cephalic
 Placenta:               Right lateral
 P. Cord Insertion:      Visualized

 Amniotic Fluid
 AFI FV:      Within normal limits

 AFI Sum(cm)     %Tile       Largest Pocket(cm)
 13.21           42

 RUQ(cm)       RLQ(cm)       LUQ(cm)        LLQ(cm)


 Comment:    No placental abruption or previa identified.
OB History

 Gravidity:    2         Term:   1        Prem:   0        SAB:   0
 TOP:          0       Ectopic:  0        Living: 1
Gestational Age

 Best:          33w 4d     Det. By:  Early Ultrasound         EDD:   09/13/19
Anatomy

 Stomach:               Appears normal, left   Bladder:                Appears normal
                        sided
Cervix Uterus Adnexa

 Cervix
 Not visualized (advanced GA >01wks)

 Adnexa
 No abnormality visualized.
Impression

 History of fall.
 A limited ultrasound study was performed. Amniotic fluid is
 normal and good fetal activity is seen. Placenta looks normal
 with no evidence of abruption. Ultrasound has limitations in
 diagnosing placental abruption.
                 Ahuama, Fresh-Kid

## 2022-07-15 LAB — OB RESULTS CONSOLE GBS: GBS: NEGATIVE

## 2022-08-12 ENCOUNTER — Encounter (HOSPITAL_COMMUNITY): Payer: Self-pay | Admitting: *Deleted

## 2022-08-12 ENCOUNTER — Other Ambulatory Visit: Payer: Self-pay | Admitting: Obstetrics and Gynecology

## 2022-08-12 ENCOUNTER — Telehealth (HOSPITAL_COMMUNITY): Payer: Self-pay | Admitting: *Deleted

## 2022-08-12 NOTE — Telephone Encounter (Signed)
Preadmission screen  

## 2022-08-16 ENCOUNTER — Encounter (HOSPITAL_COMMUNITY): Payer: Self-pay | Admitting: *Deleted

## 2022-08-19 ENCOUNTER — Inpatient Hospital Stay (HOSPITAL_COMMUNITY)
Admission: RE | Admit: 2022-08-19 | Payer: Medicaid Other | Source: Home / Self Care | Admitting: Obstetrics and Gynecology

## 2022-08-19 ENCOUNTER — Inpatient Hospital Stay (HOSPITAL_COMMUNITY): Payer: Medicaid Other | Attending: Obstetrics and Gynecology

## 2022-08-19 ENCOUNTER — Other Ambulatory Visit: Payer: Self-pay

## 2022-08-19 ENCOUNTER — Encounter (HOSPITAL_COMMUNITY): Payer: Self-pay | Admitting: Obstetrics and Gynecology

## 2022-08-19 ENCOUNTER — Other Ambulatory Visit: Payer: Self-pay | Admitting: Obstetrics and Gynecology

## 2022-08-19 ENCOUNTER — Inpatient Hospital Stay (HOSPITAL_COMMUNITY)
Admission: AD | Admit: 2022-08-19 | Discharge: 2022-08-22 | DRG: 787 | Disposition: A | Discharge status: Home / Self Care | Payer: Medicaid Other | Attending: Obstetrics and Gynecology | Admitting: Obstetrics and Gynecology

## 2022-08-19 DIAGNOSIS — O9952 Diseases of the respiratory system complicating childbirth: Secondary | ICD-10-CM | POA: Diagnosis not present

## 2022-08-19 DIAGNOSIS — O34211 Maternal care for low transverse scar from previous cesarean delivery: Secondary | ICD-10-CM | POA: Diagnosis present

## 2022-08-19 DIAGNOSIS — O9972 Diseases of the skin and subcutaneous tissue complicating childbirth: Secondary | ICD-10-CM | POA: Diagnosis present

## 2022-08-19 DIAGNOSIS — Z98891 History of uterine scar from previous surgery: Secondary | ICD-10-CM

## 2022-08-19 DIAGNOSIS — D62 Acute posthemorrhagic anemia: Secondary | ICD-10-CM | POA: Diagnosis not present

## 2022-08-19 DIAGNOSIS — L910 Hypertrophic scar: Secondary | ICD-10-CM | POA: Diagnosis present

## 2022-08-19 DIAGNOSIS — O9902 Anemia complicating childbirth: Secondary | ICD-10-CM | POA: Diagnosis present

## 2022-08-19 DIAGNOSIS — O480 Post-term pregnancy: Principal | ICD-10-CM | POA: Diagnosis present

## 2022-08-19 DIAGNOSIS — J45909 Unspecified asthma, uncomplicated: Secondary | ICD-10-CM | POA: Diagnosis not present

## 2022-08-19 DIAGNOSIS — O339 Maternal care for disproportion, unspecified: Secondary | ICD-10-CM | POA: Diagnosis present

## 2022-08-19 DIAGNOSIS — O34219 Maternal care for unspecified type scar from previous cesarean delivery: Secondary | ICD-10-CM | POA: Diagnosis not present

## 2022-08-19 DIAGNOSIS — Z3A41 41 weeks gestation of pregnancy: Secondary | ICD-10-CM | POA: Diagnosis not present

## 2022-08-19 MED ORDER — LACTATED RINGERS IV SOLN: 500.0000 mL | INTRAVENOUS | Status: DC | PRN

## 2022-08-19 MED ORDER — ACETAMINOPHEN 325 MG PO TABS: 650.0000 mg | ORAL_TABLET | ORAL | Status: DC | PRN

## 2022-08-19 MED ORDER — ONDANSETRON HCL 4 MG/2ML IJ SOLN: 4.0000 mg | Freq: Four times a day (QID) | INTRAMUSCULAR | Status: DC | PRN

## 2022-08-19 MED ORDER — SOD CITRATE-CITRIC ACID 500-334 MG/5ML PO SOLN
30.0000 mL | ORAL | Status: DC | PRN
  Filled 2022-08-19: qty 30

## 2022-08-19 MED ORDER — LACTATED RINGERS IV SOLN: INTRAVENOUS | Status: DC

## 2022-08-19 MED ORDER — LIDOCAINE HCL (PF) 1 % IJ SOLN: 30.0000 mL | INTRAMUSCULAR | Status: DC | PRN

## 2022-08-19 MED ORDER — OXYTOCIN-SODIUM CHLORIDE 30-0.9 UT/500ML-% IV SOLN
2.5000 [IU]/h | INTRAVENOUS | Status: DC
  Filled 2022-08-19: qty 500

## 2022-08-19 MED ORDER — OXYTOCIN BOLUS FROM INFUSION: 333.0000 mL | Freq: Once | INTRAVENOUS | Status: DC

## 2022-08-19 MED ORDER — FLEET ENEMA 7-19 GM/118ML RE ENEM: 1.0000 | ENEMA | RECTAL | Status: DC | PRN

## 2022-08-19 NOTE — Progress Notes (Signed)
Spoke with pt about being unable to get her in for IOL today. N pt Dr. Ivin Poot would like her to come to [facility] for NST due to her being 41 weeks. Pt agreeable with this plan but very disappointed she will not get in today.

## 2022-08-19 NOTE — Progress Notes (Signed)
Pt informed that the ultrasound is considered a limited OB ultrasound and is not intended to be a complete ultrasound exam.  Patient also informed that the ultrasound is not being completed with the intent of assessing for fetal or placental anomalies or any pelvic abnormalities.  Explained that the purpose of today's ultrasound is to assess for  presentation.  Patient acknowledges the purpose of the exam and the limitations of the study.    Cephalic presentation confirmed.  

## 2022-08-19 NOTE — [facility] Note (Signed)
Pt here for NST. Denies ctx, LOF, or vaginal bleeding. +FM

## 2022-08-20 ENCOUNTER — Other Ambulatory Visit: Payer: Self-pay

## 2022-08-20 ENCOUNTER — Inpatient Hospital Stay (HOSPITAL_COMMUNITY): Payer: Medicaid Other | Admitting: Anesthesiology

## 2022-08-20 ENCOUNTER — Encounter (HOSPITAL_COMMUNITY)
Admission: AD | Disposition: A | Discharge status: Home / Self Care | Payer: Self-pay | Source: Home / Self Care | Attending: Obstetrics and Gynecology

## 2022-08-20 DIAGNOSIS — Z3A41 41 weeks gestation of pregnancy: Secondary | ICD-10-CM

## 2022-08-20 DIAGNOSIS — O34219 Maternal care for unspecified type scar from previous cesarean delivery: Secondary | ICD-10-CM

## 2022-08-20 DIAGNOSIS — O9952 Diseases of the respiratory system complicating childbirth: Secondary | ICD-10-CM

## 2022-08-20 DIAGNOSIS — J45909 Unspecified asthma, uncomplicated: Secondary | ICD-10-CM

## 2022-08-20 DIAGNOSIS — O34211 Maternal care for low transverse scar from previous cesarean delivery: Secondary | ICD-10-CM

## 2022-08-20 DIAGNOSIS — O480 Post-term pregnancy: Secondary | ICD-10-CM

## 2022-08-20 DIAGNOSIS — Z98891 History of uterine scar from previous surgery: Secondary | ICD-10-CM

## 2022-08-20 LAB — CBC
HCT: 31.3 % — ABNORMAL LOW (ref 36.0–46.0)
HCT: 23 % — ABNORMAL LOW (ref 36.0–46.0)
Hemoglobin: 9.3 g/dL — ABNORMAL LOW (ref 12.0–15.0)
Hemoglobin: 7.2 g/dL — ABNORMAL LOW (ref 12.0–15.0)
MCH: 24.5 pg — ABNORMAL LOW (ref 26.0–34.0)
MCH: 25.3 pg — ABNORMAL LOW (ref 26.0–34.0)
MCHC: 29.7 g/dL — ABNORMAL LOW (ref 30.0–36.0)
MCHC: 31.3 g/dL (ref 30.0–36.0)
MCV: 82.6 fL (ref 80.0–100.0)
MCV: 80.7 fL (ref 80.0–100.0)
Platelets: 220 10*3/uL (ref 150–400)
Platelets: 166 10*3/uL (ref 150–400)
RBC: 3.79 MIL/uL — ABNORMAL LOW (ref 3.87–5.11)
RBC: 2.85 MIL/uL — ABNORMAL LOW (ref 3.87–5.11)
RDW: 16.1 % — ABNORMAL HIGH (ref 11.5–15.5)
RDW: 16.1 % — ABNORMAL HIGH (ref 11.5–15.5)
WBC: 9.4 10*3/uL (ref 4.0–10.5)
WBC: 14.6 10*3/uL — ABNORMAL HIGH (ref 4.0–10.5)
nRBC: 0.5 % — ABNORMAL HIGH (ref 0.0–0.2)
nRBC: 0 % (ref 0.0–0.2)

## 2022-08-20 LAB — RPR: RPR Ser Ql: NONREACTIVE

## 2022-08-20 SURGERY — Surgical Case
Anesthesia: Epidural

## 2022-08-20 MED ORDER — PHENYLEPHRINE 80 MCG/ML (10ML) SYRINGE FOR IV PUSH (FOR BLOOD PRESSURE SUPPORT)
PREFILLED_SYRINGE | INTRAVENOUS | Status: DC | PRN
  Administered 2022-08-20: 80 ug via INTRAVENOUS
  Administered 2022-08-20 (×3): 160 ug via INTRAVENOUS
  Administered 2022-08-20 (×2): 80 ug via INTRAVENOUS
  Administered 2022-08-20: 160 ug via INTRAVENOUS

## 2022-08-20 MED ORDER — EPHEDRINE 5 MG/ML INJ
10.0000 mg | INTRAVENOUS | Status: DC | PRN
  Filled 2022-08-20: qty 5

## 2022-08-20 MED ORDER — MORPHINE SULFATE (PF) 2 MG/ML IV SOLN: 1.0000 mg | INTRAVENOUS | Status: DC | PRN

## 2022-08-20 MED ORDER — LIDOCAINE HCL (PF) 1 % IJ SOLN
INTRAMUSCULAR | Status: DC | PRN
  Administered 2022-08-20: 5 mL via EPIDURAL

## 2022-08-20 MED ORDER — EPHEDRINE 5 MG/ML INJ: 10.0000 mg | INTRAVENOUS | Status: DC | PRN

## 2022-08-20 MED ORDER — ALBUMIN HUMAN 5 % IV SOLN: INTRAVENOUS | Status: DC | PRN

## 2022-08-20 MED ORDER — SIMETHICONE 80 MG PO CHEW: 80.0000 mg | CHEWABLE_TABLET | ORAL | Status: DC | PRN

## 2022-08-20 MED ORDER — SODIUM CHLORIDE (PF) 0.9 % IJ SOLN
INTRAMUSCULAR | Status: AC
  Filled 2022-08-20: qty 50

## 2022-08-20 MED ORDER — MORPHINE SULFATE (PF) 0.5 MG/ML IJ SOLN
INTRAMUSCULAR | Status: AC
  Filled 2022-08-20: qty 10

## 2022-08-20 MED ORDER — LIDOCAINE-EPINEPHRINE (PF) 2 %-1:200000 IJ SOLN
INTRAMUSCULAR | Status: AC
  Filled 2022-08-20: qty 20

## 2022-08-20 MED ORDER — SCOPOLAMINE 1 MG/3DAYS TD PT72: 1.0000 | MEDICATED_PATCH | Freq: Once | TRANSDERMAL | Status: DC

## 2022-08-20 MED ORDER — HYDROMORPHONE HCL 1 MG/ML IJ SOLN: 0.2500 mg | INTRAMUSCULAR | Status: DC | PRN

## 2022-08-20 MED ORDER — COCONUT OIL OIL: 1.0000 | TOPICAL_OIL | Status: DC | PRN

## 2022-08-20 MED ORDER — LACTATED RINGERS IV SOLN: INTRAVENOUS | Status: DC

## 2022-08-20 MED ORDER — CEFAZOLIN SODIUM-DEXTROSE 2-4 GM/100ML-% IV SOLN
INTRAVENOUS | Status: AC
  Filled 2022-08-20: qty 100

## 2022-08-20 MED ORDER — BUPIVACAINE LIPOSOME 1.3 % IJ SUSP
INTRAMUSCULAR | Status: AC
  Filled 2022-08-20: qty 20

## 2022-08-20 MED ORDER — ONDANSETRON HCL 4 MG/2ML IJ SOLN: 4.0000 mg | Freq: Three times a day (TID) | INTRAMUSCULAR | Status: DC | PRN

## 2022-08-20 MED ORDER — DIBUCAINE (PERIANAL) 1 % EX OINT: 1.0000 | TOPICAL_OINTMENT | CUTANEOUS | Status: DC | PRN

## 2022-08-20 MED ORDER — OXYTOCIN-SODIUM CHLORIDE 30-0.9 UT/500ML-% IV SOLN
2.5000 [IU]/h | INTRAVENOUS | Status: AC
  Administered 2022-08-20: 2.5 [IU]/h via INTRAVENOUS
  Filled 2022-08-20: qty 500

## 2022-08-20 MED ORDER — ZOLPIDEM TARTRATE 5 MG PO TABS: 5.0000 mg | ORAL_TABLET | Freq: Every evening | ORAL | Status: DC | PRN

## 2022-08-20 MED ORDER — NALOXONE HCL 4 MG/10ML IJ SOLN: 1.0000 ug/kg/h | INTRAVENOUS | Status: DC | PRN

## 2022-08-20 MED ORDER — BUPIVACAINE HCL (PF) 0.25 % IJ SOLN
INTRAMUSCULAR | Status: AC
  Filled 2022-08-20: qty 30

## 2022-08-20 MED ORDER — LIDOCAINE-EPINEPHRINE (PF) 2 %-1:200000 IJ SOLN
INTRAMUSCULAR | Status: DC | PRN
  Administered 2022-08-20 (×2): 5 mL via EPIDURAL

## 2022-08-20 MED ORDER — PHENYLEPHRINE 80 MCG/ML (10ML) SYRINGE FOR IV PUSH (FOR BLOOD PRESSURE SUPPORT): 80.0000 ug | PREFILLED_SYRINGE | INTRAVENOUS | Status: DC | PRN

## 2022-08-20 MED ORDER — FENTANYL-BUPIVACAINE-NACL 0.5-0.125-0.9 MG/250ML-% EP SOLN
12.0000 mL/h | EPIDURAL | Status: DC | PRN
  Filled 2022-08-20: qty 250

## 2022-08-20 MED ORDER — KETOROLAC TROMETHAMINE 30 MG/ML IJ SOLN
30.0000 mg | Freq: Four times a day (QID) | INTRAMUSCULAR | Status: AC
  Administered 2022-08-20 – 2022-08-21 (×4): 30 mg via INTRAVENOUS
  Filled 2022-08-20 (×4): qty 1

## 2022-08-20 MED ORDER — FENTANYL CITRATE (PF) 100 MCG/2ML IJ SOLN
INTRAMUSCULAR | Status: DC | PRN
  Administered 2022-08-20: 100 ug via EPIDURAL

## 2022-08-20 MED ORDER — MENTHOL 3 MG MT LOZG: 1.0000 | LOZENGE | OROMUCOSAL | Status: DC | PRN

## 2022-08-20 MED ORDER — MORPHINE SULFATE (PF) 0.5 MG/ML IJ SOLN
INTRAMUSCULAR | Status: DC | PRN
  Administered 2022-08-20: 3 mg via EPIDURAL

## 2022-08-20 MED ORDER — PRENATAL MULTIVITAMIN CH
1.0000 | ORAL_TABLET | Freq: Every day | ORAL | Status: DC
  Administered 2022-08-21 – 2022-08-22 (×2): 1 via ORAL
  Filled 2022-08-20 (×2): qty 1

## 2022-08-20 MED ORDER — BUPIVACAINE HCL (PF) 0.25 % IJ SOLN: 30.0000 mL | Freq: Once | INTRAMUSCULAR | Status: DC

## 2022-08-20 MED ORDER — TERBUTALINE SULFATE 1 MG/ML IJ SOLN
0.2500 mg | Freq: Once | INTRAMUSCULAR | Status: DC | PRN
  Filled 2022-08-20: qty 1

## 2022-08-20 MED ORDER — SODIUM CHLORIDE 0.9% FLUSH: 3.0000 mL | INTRAVENOUS | Status: DC | PRN

## 2022-08-20 MED ORDER — CHLOROPROCAINE HCL (PF) 3 % IJ SOLN
INTRAMUSCULAR | Status: AC
  Filled 2022-08-20: qty 20

## 2022-08-20 MED ORDER — OXYTOCIN-SODIUM CHLORIDE 30-0.9 UT/500ML-% IV SOLN
INTRAVENOUS | Status: DC | PRN
  Administered 2022-08-20: 30 [IU] via INTRAVENOUS

## 2022-08-20 MED ORDER — SENNOSIDES-DOCUSATE SODIUM 8.6-50 MG PO TABS
2.0000 | ORAL_TABLET | Freq: Every day | ORAL | Status: DC
  Administered 2022-08-21 – 2022-08-22 (×2): 2 via ORAL
  Filled 2022-08-20 (×2): qty 2

## 2022-08-20 MED ORDER — NALOXONE HCL 0.4 MG/ML IJ SOLN: 0.4000 mg | INTRAMUSCULAR | Status: DC | PRN

## 2022-08-20 MED ORDER — CEFAZOLIN SODIUM-DEXTROSE 2-3 GM-%(50ML) IV SOLR
INTRAVENOUS | Status: DC | PRN
  Administered 2022-08-20: 2 g via INTRAVENOUS

## 2022-08-20 MED ORDER — DIPHENHYDRAMINE HCL 50 MG/ML IJ SOLN: 12.5000 mg | INTRAMUSCULAR | Status: DC | PRN

## 2022-08-20 MED ORDER — ONDANSETRON HCL 4 MG/2ML IJ SOLN
INTRAMUSCULAR | Status: DC | PRN
  Administered 2022-08-20: 4 mg via INTRAVENOUS

## 2022-08-20 MED ORDER — IBUPROFEN 600 MG PO TABS
600.0000 mg | ORAL_TABLET | Freq: Four times a day (QID) | ORAL | Status: DC
  Administered 2022-08-22 (×3): 600 mg via ORAL
  Filled 2022-08-20 (×3): qty 1

## 2022-08-20 MED ORDER — ACETAMINOPHEN 10 MG/ML IV SOLN
INTRAVENOUS | Status: DC | PRN
  Administered 2022-08-20: 1000 mg via INTRAVENOUS

## 2022-08-20 MED ORDER — FERROUS SULFATE 325 (65 FE) MG PO TABS
325.0000 mg | ORAL_TABLET | Freq: Two times a day (BID) | ORAL | Status: DC
  Administered 2022-08-21 (×2): 325 mg via ORAL
  Filled 2022-08-20 (×3): qty 1

## 2022-08-20 MED ORDER — DIPHENHYDRAMINE HCL 25 MG PO CAPS
25.0000 mg | ORAL_CAPSULE | Freq: Four times a day (QID) | ORAL | Status: DC | PRN
  Administered 2022-08-21: 25 mg via ORAL
  Filled 2022-08-20: qty 1

## 2022-08-20 MED ORDER — FENTANYL-BUPIVACAINE-NACL 0.5-0.125-0.9 MG/250ML-% EP SOLN
EPIDURAL | Status: DC | PRN
  Administered 2022-08-20: 12 mL/h via EPIDURAL

## 2022-08-20 MED ORDER — ONDANSETRON HCL 4 MG/2ML IJ SOLN: 4.0000 mg | Freq: Once | INTRAMUSCULAR | Status: DC | PRN

## 2022-08-20 MED ORDER — KETOROLAC TROMETHAMINE 30 MG/ML IJ SOLN: 30.0000 mg | Freq: Once | INTRAMUSCULAR | Status: DC | PRN

## 2022-08-20 MED ORDER — BUPIVACAINE LIPOSOME 1.3 % IJ SUSP
20.0000 mL | Freq: Once | INTRAMUSCULAR | Status: AC
  Administered 2022-08-20: 266 mg

## 2022-08-20 MED ORDER — FENTANYL CITRATE (PF) 100 MCG/2ML IJ SOLN
INTRAMUSCULAR | Status: AC
  Filled 2022-08-20: qty 2

## 2022-08-20 MED ORDER — OXYTOCIN-SODIUM CHLORIDE 30-0.9 UT/500ML-% IV SOLN
1.0000 m[IU]/min | INTRAVENOUS | Status: DC
  Administered 2022-08-20: 2 m[IU]/min via INTRAVENOUS

## 2022-08-20 MED ORDER — ALBUMIN HUMAN 5 % IV SOLN
INTRAVENOUS | Status: AC
  Filled 2022-08-20: qty 250

## 2022-08-20 MED ORDER — LACTATED RINGERS IV SOLN: 500.0000 mL | Freq: Once | INTRAVENOUS | Status: DC

## 2022-08-20 MED ORDER — SIMETHICONE 80 MG PO CHEW
80.0000 mg | CHEWABLE_TABLET | Freq: Three times a day (TID) | ORAL | Status: DC
  Administered 2022-08-21 – 2022-08-22 (×5): 80 mg via ORAL
  Filled 2022-08-20 (×5): qty 1

## 2022-08-20 MED ORDER — OXYCODONE HCL 5 MG PO TABS: 5.0000 mg | ORAL_TABLET | ORAL | Status: DC | PRN

## 2022-08-20 MED ORDER — OXYCODONE HCL 5 MG/5ML PO SOLN: 5.0000 mg | Freq: Once | ORAL | Status: DC | PRN

## 2022-08-20 MED ORDER — PHENYLEPHRINE 80 MCG/ML (10ML) SYRINGE FOR IV PUSH (FOR BLOOD PRESSURE SUPPORT)
80.0000 ug | PREFILLED_SYRINGE | INTRAVENOUS | Status: DC | PRN
  Filled 2022-08-20: qty 10

## 2022-08-20 MED ORDER — ACETAMINOPHEN 500 MG PO TABS
1000.0000 mg | ORAL_TABLET | Freq: Four times a day (QID) | ORAL | Status: DC
  Administered 2022-08-21 – 2022-08-22 (×7): 1000 mg via ORAL
  Filled 2022-08-20 (×7): qty 2

## 2022-08-20 MED ORDER — TRANEXAMIC ACID-NACL 1000-0.7 MG/100ML-% IV SOLN
INTRAVENOUS | Status: DC | PRN
  Administered 2022-08-20: 1000 mg via INTRAVENOUS

## 2022-08-20 MED ORDER — MEPERIDINE HCL 25 MG/ML IJ SOLN: 6.2500 mg | INTRAMUSCULAR | Status: DC | PRN

## 2022-08-20 MED ORDER — SODIUM BICARBONATE 8.4 % IV SOLN: INTRAVENOUS | Status: DC | PRN

## 2022-08-20 MED ORDER — OXYCODONE HCL 5 MG PO TABS: 5.0000 mg | ORAL_TABLET | Freq: Once | ORAL | Status: DC | PRN

## 2022-08-20 MED ORDER — DIPHENHYDRAMINE HCL 25 MG PO CAPS: 25.0000 mg | ORAL_CAPSULE | ORAL | Status: DC | PRN

## 2022-08-20 MED ORDER — WITCH HAZEL-GLYCERIN EX PADS: 1.0000 | MEDICATED_PAD | CUTANEOUS | Status: DC | PRN

## 2022-08-20 SURGICAL SUPPLY — 40 items
APL PRP STRL LF DISP 70% ISPRP (MISCELLANEOUS) ×2
APL SKNCLS STERI-STRIP NONHPOA (GAUZE/BANDAGES/DRESSINGS) ×1
BARRIER ADHS 3X4 INTERCEED (GAUZE/BANDAGES/DRESSINGS) IMPLANT
BENZOIN TINCTURE PRP APPL 2/3 (GAUZE/BANDAGES/DRESSINGS) ×2 IMPLANT
BRR ADH 4X3 ABS CNTRL BYND (GAUZE/BANDAGES/DRESSINGS) ×1
CHLORAPREP W/TINT 26 (MISCELLANEOUS) ×4 IMPLANT
CLAMP UMBILICAL CORD (MISCELLANEOUS) ×2 IMPLANT
CLOTH BEACON ORANGE TIMEOUT ST (SAFETY) ×2 IMPLANT
DRAPE C SECTION CLR SCREEN (DRAPES) ×2 IMPLANT
DRSG OPSITE POSTOP 4X10 (GAUZE/BANDAGES/DRESSINGS) ×2 IMPLANT
ELECT REM PT RETURN 9FT ADLT (ELECTROSURGICAL) ×1
ELECTRODE REM PT RTRN 9FT ADLT (ELECTROSURGICAL) ×2 IMPLANT
EXTRACTOR VACUUM KIWI (MISCELLANEOUS) IMPLANT
GAUZE PAD ABD 7.5X8 STRL (GAUZE/BANDAGES/DRESSINGS) IMPLANT
GAUZE SPONGE 4X4 16PLY XRAY LF (GAUZE/BANDAGES/DRESSINGS) IMPLANT
GLOVE BIO SURGEON STRL SZ 6.5 (GLOVE) ×2 IMPLANT
GLOVE BIOGEL PI IND STRL 7.0 (GLOVE) ×4 IMPLANT
GLOVE SURG SS PI 6.5 STRL IVOR (GLOVE) ×2 IMPLANT
GOWN STRL REUS W/ TWL LRG LVL3 (GOWN DISPOSABLE) ×6 IMPLANT
GOWN STRL REUS W/TWL LRG LVL3 (GOWN DISPOSABLE) ×3
KIT ABG SYR 3ML LUER SLIP (SYRINGE) IMPLANT
NDL HYPO 25X5/8 SAFETYGLIDE (NEEDLE) IMPLANT
NEEDLE HYPO 25X5/8 SAFETYGLIDE (NEEDLE) IMPLANT
NS IRRIG 1000ML POUR BTL (IV SOLUTION) ×2 IMPLANT
PACK C SECTION WH (CUSTOM PROCEDURE TRAY) ×2 IMPLANT
PAD OB MATERNITY 4.3X12.25 (PERSONAL CARE ITEMS) ×2 IMPLANT
RTRCTR C-SECT PINK 25CM LRG (MISCELLANEOUS) ×2 IMPLANT
STRIP CLOSURE SKIN 1/2X4 (GAUZE/BANDAGES/DRESSINGS) ×2 IMPLANT
SUT MNCRL 0 VIOLET CTX 36 (SUTURE) ×4 IMPLANT
SUT MNCRL+ AB 3-0 CT1 36 (SUTURE) ×4 IMPLANT
SUT MONOCRYL 0 CTX 36 (SUTURE) ×2
SUT MONOCRYL AB 3-0 CT1 36IN (SUTURE) ×2
SUT PDS AB 0 CTX 36 PDP370T (SUTURE) ×4 IMPLANT
SUT PLAIN 0 NONE (SUTURE) IMPLANT
SUT VIC AB 2-0 CT1 27 (SUTURE)
SUT VIC AB 2-0 CT1 TAPERPNT 27 (SUTURE) IMPLANT
SUT VIC AB 4-0 KS 27 (SUTURE) ×2 IMPLANT
TOWEL OR 17X24 6PK STRL BLUE (TOWEL DISPOSABLE) ×4 IMPLANT
TRAY FOLEY W/BAG SLVR 14FR LF (SET/KITS/TRAYS/PACK) ×2 IMPLANT
WATER STERILE IRR 1000ML POUR (IV SOLUTION) ×2 IMPLANT

## 2022-08-20 NOTE — Anesthesia Procedure Notes (Signed)
Epidural Patient location during procedure: OB Start time: 08/20/2022 12:26 PM End time: 08/20/2022 12:38 PM  Staffing Anesthesiologist: Barnet Glasgow, MD Performed: anesthesiologist   Preanesthetic Checklist Completed: patient identified, IV checked, site marked, risks and benefits discussed, surgical consent, monitors and equipment checked, pre-op evaluation and timeout performed  Epidural Patient position: sitting Prep: DuraPrep and site prepped and draped Patient monitoring: continuous pulse ox and blood pressure Approach: midline Location: L3-L4 Injection technique: LOR air  Needle:  Needle type: Tuohy  Needle gauge: 17 G Needle length: 9 cm and 9 Needle insertion depth: 7 cm Catheter type: closed end flexible Catheter size: 19 Gauge Catheter at skin depth: 12 cm Test dose: negative  Assessment Events: blood not aspirated, no cerebrospinal fluid, injection not painful, no injection resistance, no paresthesia and negative IV test  Additional Notes Patient identified. Risks/Benefits/Options discussed with patient including but not limited to bleeding, infection, nerve damage, paralysis, failed block, incomplete pain control, headache, blood pressure changes, nausea, vomiting, reactions to medication both or allergic, itching and postpartum back pain. Confirmed with bedside nurse the patient's most recent platelet count. Confirmed with patient that they are not currently taking any anticoagulation, have any bleeding history or any family history of bleeding disorders. Patient expressed understanding and wished to proceed. All questions were answered. Sterile technique was used throughout the entire procedure. Please see nursing notes for vital signs. Test dose was given through epidural needle and negative prior to continuing to dose epidural or start infusion. Warning signs of high block given to the patient including shortness of breath, tingling/numbness in hands, complete  motor block, or any concerning symptoms with instructions to call for help. Patient was given instructions on fall risk and not to get out of bed. All questions and concerns addressed with instructions to call with any issues.  1 Attempt (S) . Patient tolerated procedure well.

## 2022-08-20 NOTE — H&P (Signed)
OB ADMISSION/ HISTORY & PHYSICAL:  Admission Date: 08/19/2022  9:33 PM  Admit Diagnosis: Normal labor  Felicia Klein is a 32 y.o. female [phone] [redacted]w[redacted]d presenting for IOL for dates. Endorses active FM, denies LOF and vaginal bleeding. Previous primary cesarean section in 2021 for FTP with OP presentation with 2-layer closure. Pt desires trial of labor.   History of current pregnancy: [id number]   Patient entered care with [facility] at 10+3 wks.   EDC 08/12/22 by LMP and congruent w/ 10+6 wk U/S.   Anatomy scan:  21+6 wks, complete w/ anterior placenta.   Antenatal testing: N/A  Significant prenatal events:  Patient Active Problem List   Diagnosis Date Noted   Hx of cesarean section 08/20/2022   Post term pregnancy 08/19/2022   Fibrocystic breast disease 11/27/2012    Prenatal Labs: ABO, Rh: --/--/A POS (03/15 2305) Antibody: NEG (03/15 2305) Rubella: Immune (08/14 0000)  RPR: Nonreactive (08/14 0000)  HBsAg: Negative (08/14 0000)  HIV: Non-reactive (08/14 0000)  GTT: normal 3 hr GBS: Negative/-- (02/09 0000)  GC/CHL: neg/neg Genetics: declined Vaccines: Tdap: declined Influenza: declined   OB History  Gravida Para Term Preterm AB Living  4 2 2   1 2   SAB IAB Ectopic Multiple Live Births  1     0 2    # Outcome Date GA Lbr Len/2nd Weight Sex Delivery Anes PTL Lv  4 Current           3 Term 09/17/19 [redacted]w[redacted]d  4280 g M CS-LTranv EPI  LIV     Birth Comments: wnl  2 Term 12/25/13 [redacted]w[redacted]d 14:30 / 01:28 3705 g F Vag-Spont EPI  LIV     Birth Comments: wnl  1 SAB             Medical / Surgical History: Past medical history:  Past Medical History:  Diagnosis Date   Asthma    childhood   Cellulitis    legs   Eczema    Hx of varicella     Past surgical history:  Past Surgical History:  Procedure Laterality Date   CESAREAN SECTION N/A 09/17/2019   Procedure: CESAREAN SECTION;  Surgeon: Vanessa Kick, MD;  Location: [facility] LD ORS;  Service: Obstetrics;  Laterality: N/A;   Family  History:  Family History  Problem Relation Age of Onset   Diabetes Father    Diabetes Maternal Grandmother    Cancer Maternal Grandmother        peritoneal cancer-dec age 40   Hypertension Maternal Grandmother    Hypertension Mother    Diabetes Paternal Grandmother    Thyroid disease Paternal Grandmother     Social History:  reports that she has never smoked. She has never used smokeless tobacco. She reports that she does not currently use alcohol. She reports that she does not use drugs.  Allergies: Patient has no known allergies.   Current Medications at time of admission:  Prior to Admission medications   Medication Sig Start Date End Date Taking? Authorizing Provider  Prenatal Vit-Fe Fumarate-FA (PRENATAL MULTIVITAMIN) TABS tablet Take 1 tablet by mouth daily at 12 noon.   Yes [provider]  Ferrous Fumarate (HEMOCYTE - 106 MG FE) 324 (106 Fe) MG TABS tablet Take 1 tablet (106 mg of iron total) by mouth 2 (two) times daily. 09/19/19   Olga Millers, MD  ibuprofen (ADVIL) 600 MG tablet Take 1 tablet (600 mg total) by mouth every 6 (six) hours as needed for mild pain, moderate  pain or cramping. 09/19/19   Olga Millers, MD    Review of Systems: Constitutional: Negative   HENT: Negative   Eyes: Negative   Respiratory: Negative   Cardiovascular: Negative   Gastrointestinal: Negative  Genitourinary: neg for bloody show, neg for LOF   Musculoskeletal: Negative   Skin: Negative   Neurological: Negative   Endo/Heme/Allergies: Negative   Psychiatric/Behavioral: Negative    Physical Exam: VS: Blood pressure 111/69, pulse 69, temperature 98 F (36.7 C), temperature source Oral, resp. rate 14, height 5\' 9"  (1.753 m), weight 90.7 kg, unknown if currently breastfeeding. AAO x3, no signs of distress Cardiovascular: RRR Respiratory: Unlabored GU/GI: Abdomen gravid, non-tender, non-distended, active FM, vertex Extremities: no edema, negative for pain, tenderness, and  cords  Cervical exam:Dilation: 4 Effacement (%): 50 Station: -2 Exam by:: S, Shell, RN FHR: baseline rate 130 / variability moderate / accelerations present / absent decelerations TOCO: 2-4   Prenatal Transfer Tool  Maternal Diabetes: No, normal 3 hr gtt Genetic Screening: Declined Maternal Ultrasounds/Referrals: Normal Fetal Ultrasounds or other Referrals:  None Maternal Substance Abuse:  No Significant Maternal Medications:  None Significant Maternal Lab Results: Group B Strep negative Number of Prenatal Visits:greater than 3 verified prenatal visits Other Comments:  None    Assessment: 32 y.o. [id number] [redacted]w[redacted]d IOL for dates Prev C/S, desires trial of labor  Latent phase of labor FHR category 1 GBS neg Pain management plan: per pt's request   Plan:  Admit to L&D Routine admission orders Epidural PRN Pitocin  Dr Delora Fuel notified of admission and plan of care  Arrie Eastern DNP, CNM 08/20/2022 5:50 AM

## 2022-08-20 NOTE — Progress Notes (Addendum)
OB Progress Note  S: Patient feels comfortable with epidural.  O: BP (!) 98/54   Pulse 60   Temp 97.7 F (36.5 C) (Oral)   Resp 18   Ht 5\' 9"  (1.753 m)   Wt 90.7 kg   SpO2 97%   BMI 29.53 kg/m   FHT: 125bpm, moderate variablity, + accels, recurrent late decels Toco: q1-2 minutes SVE: 8/80/-1, sutures palpated, occiput posterior IUPC in place  Bedside US: Fetus occiput posterior (orbits are anterior)  A/P: 32 y.o. [id number] @ [redacted]w[redacted]d admitted for induction of labor/TOLAC at late term.  FWB: Cat. II  Labor course: Pitocin at 52mU/min, s/p AROM at 0611, IUPC placed, MVUs ~140, due to recurrent late decelerations, pitocin turned down to 51mU/min and changing patient's position and a fluid bolus is being administered Pain: Per patient request GBS: Negative Anticipate SVD  Patient's protracted active phase likely secondary to occiput posterior position. I counseled the patient that it is not impossible to deliver vaginally; however, it may take significantly longer than with an occiput anterior position. Pitocin was turned down to half due to recurrent late decelerations. Primary RN to work on position changes to encourage fetal rotation. Will titrate Pitocin back up 2x2 up to a maximum of 73mU/min only if fetus tolerates this. I counseled the patient of the possibility of needing to consider cesarean delivery if labor course is still protracted or if fetus does not tolerate labor. She would like to wait until about 1730 to reassess and make a decision on how she would like to proceed.  Drema Dallas, DO

## 2022-08-20 NOTE — Progress Notes (Signed)
LATE ENTRY  In to room to re-evaluate for cervical change. Cervix 8/80/-1. Fetal heart tracing still with recurrent late decelerations and more recently recurrent variables. Pitocin still at 14mU/min. Contractions occurring every 2 minutes. After discussion with the patient, cesarean delivery was recommended and patient was agreeable. During our discussion, fetal heart rate dropped to the 90s for at least 5 minutes. Pitocin was turned off and Terbutaline was administered. STAT cesarean ws called and patient was taken to the OR.  I have explained to the patient that this surgery is performed to deliver their baby or babies through an incision in the abdomen and incision in the uterus.  Prior to surgery, the risks and benefits of the surgery, as well as alternative treatments were discussed.  The risks include, but are not limited to, possible need for cesarean delivery for all future pregnancies, bleeding at the time of surgery that could necessitate a blood transfusion and/or hysterectomy, rupture of the uterus during a future pregnancy that could cause a preterm delivery and/or requiring hysterectomy, infection, damage to surrounding organs and tissues, damage to bladder, damage to ureters, causing kidney damage, and requiring additional procedures, damage to bowels, resulting in further surgery, postoperative pain, short-term and long-term, scarring on the abdominal wall and intra-abdominally, need for further surgery, development of an incisional hernia, deep vein thrombosis and/or pulmonary embolism, wound infection and/or separation, painful intercourse, urinary leakage, impact on future pregnancies including but not limited to, abnormal location or attachment of the placenta to the uterus, such as placenta previa or accreta, that may necessitate a blood transfusion and/or hysterectomy, impact on total family size, complications the course of which cannot be predicted or prevented, and death. Patient was  consented for blood products.  The patient is aware that bleeding may result in the need for a blood transfusion which includes risk of transmission of HIV (1:2 million), Hepatitis C (1:2 million), and Hepatitis B (1:200 thousand) and transfusion reaction.  Patient voiced understanding of the above risks as well as understanding of indications for blood transfusion.  Drema Dallas, DO

## 2022-08-20 NOTE — Op Note (Addendum)
Pre Op Dx:   1. Single live IUP at [redacted]w[redacted]d 2. Fetal bradycardia 3. Failure to progress 4. History of cesarean section x 1  Post Op Dx:  1. Single live IUP at [redacted]w[redacted]d 2. Fetal bradycardia 3. Failure to progress 4. History of cesarean section x 1 5. Cephalopelvic disproportion  Procedure:   1. Repeat Low Transverse Cesarean Section 2. Keloid scar revision  Surgeon:  Dr. Wellington Hampshire. Delora Fuel Assistants:  Dr. Gifford Shave [facility] Fellow) and Dr. 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 Normal-appearing uterus, bilateral fallopian tubes, and ovaries. Thin lower uterine segment between with scarring between the bladder and lower uterine segment. Fetus in cephalic (occiput posterior) position. APGAR: 9/9. Infant weight: 9lb 8oz.  Complications: None Indications:  32 y.o. [id number] at [redacted]w[redacted]d undergoing induction of labor/TOLAC at late term with failure to progress and fetal bradycardia.  Procedure:  After informed consent was obtained, the patient was brought to the operating room.  Following confirmation of adequate epidural anesthesia, the patient was positioned in dorsal supine position with a leftward tilt and was prepped and draped in sterile fashion.  A preoperative time-out was performed.  The abdomen was entered in layers through a pfannenstiel incision and a retractor was placed.  A low transverse hysterotomy was created sharply to the level of the membranes, then extended bluntly.  The fetus was delivered from cephalic presentation onto the field with the assistance of a Kiwi vacuum due to fetal position. Vigorous cry noted. The cord was doubly clamped and cut after a 60 second pause.   The newborn was passed to the warmer.  The placenta was delivered easily and in its entirety.  The uterus was swept free of clots and debris and closed in a running locked fashion with 0-Monocryl, followed by 0-Monocryl in an imbricating fashion.  Hemostasis was verified.  The abdomen was irrigated with warmed saline and cleared of clots. Intercede was placed atop the hysterotomy.  The peritoneum was closed in a running fashion with 2-0 Vicryl.  Subfascial spaces were inspected and hemostasis assured.  The fascia was closed in a running fashion with 0-PDS. The keloid scar was marked and removed sharply. A mixture of Exparel and bupivacaine was placed in the fascia and subcutaneous tissues. The subcutaneous tissues were irrigated and hemostasis assured.  The subcutaneous tissues were closed with 3-0 Monocryl.  The skin was closed with 4-0 Vicryl. A superficial area of separation was noted in the skin closure on the right aspect of the pfannenstiel incision - 4-0 Vicryl was used to reapproximate this area. A sterile bandage was applied.  The patient was transferred to PACU.  All needle, sponge, and instrument counts were correct at the end of the case.    Disposition:  PACU  Comments: I performed the procedure and the assistant was needed due to the complexity of the anatomy. An experienced assistant was required given the standard of surgical care given the complexity of the case.  This assistant was needed for exposure, dissection, suctioning, retraction, instrument exchange, assisting with delivery with administration of fundal pressure, and for overall help during the procedure.    Drema Dallas, DO

## 2022-08-20 NOTE — Progress Notes (Signed)
OB Progress Note  S: Patient feeling more intensity with contractions. Has been in hands and knees position.   O: BP 114/74   Pulse 62   Temp 98.1 F (36.7 C) (Oral)   Resp 18   Ht 5\' 9"  (1.753 m)   Wt 90.7 kg   BMI 29.53 kg/m   FHT: 125bpm, moderate variablity, + accels, - decels Toco: q1-2 minutes SVE: 6.5/50/-3, sutures palpated, fluid clear  A/P: 32 y.o. [id number] @ [redacted]w[redacted]d admitted for induction of labor/TOLAC at late term.  FWB: Cat. I Labor course: Pitocin at 83mU/min -- will increase as tolerated Pain: Per patient request GBS: Negative Anticipate SVD  Drema Dallas, DO

## 2022-08-20 NOTE — Progress Notes (Signed)
OB Progress Note  S: Patient just got epidural and feels very comfortable. Consents to IUPC placement.  O: BP 113/69   Pulse 67   Temp 97.7 F (36.5 C) (Oral)   Resp 18   Ht 5\' 9"  (1.753 m)   Wt 90.7 kg   SpO2 97%   BMI 29.53 kg/m   FHT: 125bpm, moderate variablity, + accels, - decels Toco: q1-2 minutes SVE: 7/80/-2, sutures palpated, IUPC in place  A/P: 32 y.o. [id number] @ [redacted]w[redacted]d admitted for induction of labor/TOLAC at late term.  FWB: Cat. I Labor course: Pitocin at 15mU/min, s/p AROM at 0611,  IUPC placed now, MVUs only appear to be at 50, will titrate pitocin to maximum of 16mU/min Pain: Per patient request GBS: Negative Anticipate SVD  Reviewed patient's labor course. Leopold's ~8.5lbs and pelvis feels adequate. Will work towards more adequate contractions as I suspect this is the reason for slow cervical change. Will reassess in ~3 hours.  Drema Dallas, DO

## 2022-08-20 NOTE — Transfer of Care (Signed)
Immediate Anesthesia Transfer of Care Note  Patient: Felicia Klein  Procedure(s) Performed: CESAREAN SECTION  Patient Location: PACU  Anesthesia Type:Epidural  Level of Consciousness: awake, alert , and oriented  Airway & Oxygen Therapy: Patient Spontanous Breathing  Post-op Assessment: Report given to RN, Post -op Vital signs reviewed and unstable, Anesthesiologist notified, and hypotensive SBP 70-80s, additional albumin started  Post vital signs: Reviewed and unstable  Last Vitals:  Vitals Value Taken Time  BP 79/38 08/20/22 1910  Temp 36.4 C 08/20/22 1910  Pulse    Resp 15 08/20/22 1910  SpO2      Last Pain:  Vitals:   08/20/22 1910  TempSrc: Axillary  PainSc: 0-No pain      Patients Stated Pain Goal: 0 ([id number])  Complications: No notable events documented.

## 2022-08-20 NOTE — Progress Notes (Signed)
Subjective:    Breathing and coping well with contractions. Discussed amniotomy and pt agrees.   Objective:    VS: BP 113/65 (BP Location: Left Arm)   Pulse 60   Temp 98 F (36.7 C) (Oral)   Resp 18   Ht 5\' 9"  (1.753 m)   Wt 90.7 kg   BMI 29.53 kg/m  FHR : baseline 130 / variability moderate / accelerations present / absent decelerations Toco: contractions every 2 minutes  Membranes: AROM, copiousm clear fluid Dilation: 4 Effacement (%): 50 Cervical Position: Middle Station: -2 Presentation: Vertex Exam by:: S, Shell, RN Pitocin 8 reduced to 4 mU/min after AROM  Assessment/Plan:   32 y.o. [id number] [redacted]w[redacted]d  Labor: Progressing normally, titrate Pitocin PRN to achieve adequate ctx.  Fetal Wellbeing:  Category I Pain Control:   aware of options, desires an unmedicated labor at this time I/D:   GBS neg Anticipated MOD:  NSVD  Arrie Eastern DNP, CNM 08/20/2022 6:02 AM

## 2022-08-20 NOTE — Anesthesia Postprocedure Evaluation (Signed)
Anesthesia Post Note  Patient: Felicia Klein  Procedure(s) Performed: [place]     Patient location during evaluation: Mother Baby Anesthesia Type: Epidural Level of consciousness: oriented and awake and alert Pain management: pain level controlled Vital Signs Assessment: post-procedure vital signs reviewed and stable Respiratory status: spontaneous breathing and respiratory function stable Cardiovascular status: blood pressure returned to baseline and stable Postop Assessment: no headache, no backache, no apparent nausea or vomiting and able to ambulate Anesthetic complications: no  No notable events documented.  Last Vitals:  Vitals:   08/20/22 2045 08/20/22 2057  BP: (!) 103/56 104/62  Pulse:  68  Resp:  16  Temp:  36.4 C  SpO2:  100%    Last Pain:  Vitals:   08/20/22 2057  TempSrc: Oral  PainSc: 0-No pain   Pain Goal: Patients Stated Pain Goal: 0 (08/20/22 1249)              Epidural/Spinal Function Cutaneous sensation: Able to Wiggle Toes (08/20/22 2057), Patient able to flex knees: Yes (08/20/22 2057), Patient able to lift hips off bed: No (08/20/22 2057), Back pain beyond tenderness at insertion site: No (08/20/22 2057), Progressively worsening motor and/or sensory loss: No (08/20/22 2057), Bowel and/or bladder incontinence post epidural: No (08/20/22 2057)  Barnet Glasgow

## 2022-08-20 NOTE — Anesthesia Preprocedure Evaluation (Addendum)
Anesthesia Evaluation  Patient identified by MRN, date of birth, ID band Patient awake    Reviewed: Allergy & Precautions, NPO status , Patient's Chart, lab work & pertinent test results  Airway Mallampati: II  TM Distance: >3 FB Neck ROM: Full    Dental no notable dental hx. (+) Teeth Intact, Dental Advisory Given   Pulmonary asthma    Pulmonary exam normal breath sounds clear to auscultation       Cardiovascular negative cardio ROS Normal cardiovascular exam Rhythm:Regular Rate:Normal     Neuro/Psych negative neurological ROS  negative psych ROS   GI/Hepatic negative GI ROS,,,  Endo/Other  negative endocrine ROS    Renal/GU negative Renal ROS     Musculoskeletal   Abdominal   Peds  Hematology  (+) Blood dyscrasia, anemia Lab Results      Component                Value               Date                      WBC                      9.4                 08/19/2022                HGB                      9.3 (L)             08/19/2022                HCT                      31.3 (L)            08/19/2022                  Anesthesia Other Findings   Reproductive/Obstetrics (+) Pregnancy                              Anesthesia Physical Anesthesia Plan  ASA: 2  Anesthesia Plan: Epidural   Post-op Pain Management:    Induction:   PONV Risk Score and Plan: Treatment may vary due to age or medical condition  Airway Management Planned:   Additional Equipment:   Intra-op Plan:   Post-operative Plan:   Informed Consent: I have reviewed the patients History and Physical, chart, labs and discussed the procedure including the risks, benefits and alternatives for the proposed anesthesia with the patient or authorized representative who has indicated his/her understanding and acceptance.       Plan Discussed with:   Anesthesia Plan Comments: (41.1 wk G4P2 for TOLAC w LEA)          Anesthesia Quick Evaluation

## 2022-08-21 ENCOUNTER — Encounter (HOSPITAL_COMMUNITY): Payer: Self-pay | Admitting: Obstetrics and Gynecology

## 2022-08-21 DIAGNOSIS — D62 Acute posthemorrhagic anemia: Secondary | ICD-10-CM | POA: Diagnosis not present

## 2022-08-21 DIAGNOSIS — Z98891 History of uterine scar from previous surgery: Secondary | ICD-10-CM

## 2022-08-21 LAB — CBC
HCT: 20.2 % — ABNORMAL LOW (ref 36.0–46.0)
HCT: 26.9 % — ABNORMAL LOW (ref 36.0–46.0)
Hemoglobin: 6.4 g/dL — CL (ref 12.0–15.0)
Hemoglobin: 8.7 g/dL — ABNORMAL LOW (ref 12.0–15.0)
MCH: 25.9 pg — ABNORMAL LOW (ref 26.0–34.0)
MCH: 26.6 pg (ref 26.0–34.0)
MCHC: 31.7 g/dL (ref 30.0–36.0)
MCHC: 32.3 g/dL (ref 30.0–36.0)
MCV: 81.8 fL (ref 80.0–100.0)
MCV: 82.3 fL (ref 80.0–100.0)
Platelets: 164 10*3/uL (ref 150–400)
Platelets: 189 10*3/uL (ref 150–400)
RBC: 2.47 MIL/uL — ABNORMAL LOW (ref 3.87–5.11)
RBC: 3.27 MIL/uL — ABNORMAL LOW (ref 3.87–5.11)
RDW: 16.1 % — ABNORMAL HIGH (ref 11.5–15.5)
RDW: 15.8 % — ABNORMAL HIGH (ref 11.5–15.5)
WBC: 12.3 10*3/uL — ABNORMAL HIGH (ref 4.0–10.5)
WBC: 12.5 10*3/uL — ABNORMAL HIGH (ref 4.0–10.5)
nRBC: 0 % (ref 0.0–0.2)
nRBC: 0.3 % — ABNORMAL HIGH (ref 0.0–0.2)

## 2022-08-21 LAB — PREPARE RBC (CROSSMATCH)

## 2022-08-21 MED ORDER — ACETAMINOPHEN 325 MG PO TABS: 650.0000 mg | ORAL_TABLET | Freq: Once | ORAL | Status: DC

## 2022-08-21 MED ORDER — DIPHENHYDRAMINE HCL 25 MG PO CAPS
25.0000 mg | ORAL_CAPSULE | Freq: Once | ORAL | Status: AC
  Administered 2022-08-21: 25 mg via ORAL
  Filled 2022-08-21: qty 1

## 2022-08-21 MED ORDER — SODIUM CHLORIDE 0.9% IV SOLUTION: Freq: Once | INTRAVENOUS | Status: DC

## 2022-08-21 NOTE — Progress Notes (Signed)
Felicia Klein [id number] Postpartum Postoperative Day # 1  Erling Conte, [id number], [redacted]w[redacted]d, S/P Repeat LT Cesarean Section due to pt was admitted on 3/16 for TOLAC IOL postdates 41.1 weeks, progressed to 8cm with failure to progress then fetal bradycardia, went o OR with DR Delora Fuel on 3/16 with spinal epidural, EBL was 1437 with PPH, hgb drop of 9.3-7.2-6.4, pt is asymptomatic but discussed R/B/A of blood transfusion Patient was consented for blood products.  The patient is aware that bleeding may result in the need for a blood transfusion which includes risk of transmission of HIV (1:2 million), Hepatitis C (1:2 million), and Hepatitis B (1:200 thousand) and transfusion reaction.  Patient voiced understanding of the above risks as well as understanding of indications for blood transfusion.  Pt consent 2 unit RBC will be transfused on 3/17. Had a baby female that desires circ in pt prior to discharge.   Patient Active Problem List   Diagnosis Date Noted   S/P cesarean section 08/21/2022   Normal postpartum course 08/21/2022   Acute blood loss anemia 08/21/2022   Hx of cesarean section 08/20/2022   Post term pregnancy 08/19/2022   Fibrocystic breast disease 11/27/2012     Active Ambulatory Problems    Diagnosis Date Noted   Fibrocystic breast disease 11/27/2012   Resolved Ambulatory Problems    Diagnosis Date Noted   Indication for care in labor or delivery 12/24/2013   Normal delivery 12/25/2013   Fall 07/30/2019   Encounter for induction of labor 09/16/2019   Cesarean delivery, delivered, current hospitalization 09/17/2019   Past Medical History:  Diagnosis Date   Asthma    Cellulitis    Eczema    Hx of varicella      Subjective: Patient up ad lib, denies syncope or dizziness. Reports consuming regular diet without issues and denies N/V. Patient reports 0 bowel movement + passing flatus.  Denies issues with urination and reports bleeding is "lighter."  Patient is breastfeeding and  reports going well.  Desires undecided for postpartum contraception.  Pain is being appropriately managed with use of po meds. Pt hgb 6.4 and asymptomatic.    Objective: Patient Vitals for the past 24 hrs:  BP Temp Temp src Pulse Resp SpO2  08/21/22 0414 (!) 114/56 97.7 F (36.5 C) Oral 73 17 98 %  08/21/22 0021 (!) 115/56 97.6 F (36.4 C) Oral 74 16 99 %  08/20/22 2301 121/69 97.8 F (36.6 C) Oral 62 16 100 %  08/20/22 2207 111/70 97.6 F (36.4 C) Oral 66 16 100 %  08/20/22 2057 104/62 97.6 F (36.4 C) Oral 68 16 100 %  08/20/22 2045 (!) 103/56 -- -- -- -- --  08/20/22 2032 (!) 100/48 -- -- 67 15 --  08/20/22 2000 (!) 92/55 -- -- 69 14 --  08/20/22 1945 (!) 89/47 -- -- 83 14 --  08/20/22 1940 -- (!) 97.5 F (36.4 C) Axillary -- -- --  08/20/22 1935 (!) 89/63 -- -- 76 -- --  08/20/22 1926 (!) 82/48 -- -- -- 16 --  08/20/22 1910 (!) 79/38 97.6 F (36.4 C) Axillary -- 15 --  08/20/22 1731 116/68 -- -- 61 -- --  08/20/22 1701 115/69 -- -- (!) 58 -- --  08/20/22 1631 (!) 101/50 -- -- 65 -- --  08/20/22 1601 (!) 97/48 -- -- 64 -- --  08/20/22 1531 (!) 95/52 -- -- 65 -- --  08/20/22 1509 (!) 108/54 -- -- 64 -- --  08/20/22 1431 Marland Kitchen)  98/54 -- -- 60 -- --  08/20/22 1401 (!) 102/58 -- -- 70 -- --  08/20/22 1349 -- 97.7 F (36.5 C) Oral -- -- --  08/20/22 1311 113/69 -- -- 67 -- --  08/20/22 1310 -- 97.7 F (36.5 C) Oral -- -- --  08/20/22 1306 112/68 -- -- 76 -- --  08/20/22 1301 114/66 -- -- 72 -- --  08/20/22 1256 110/69 -- -- 73 -- --  08/20/22 1250 111/63 -- -- 65 -- 97 %  08/20/22 1246 109/62 -- -- 70 -- --  08/20/22 1242 109/69 -- -- 79 -- --  08/20/22 1236 129/72 -- -- 78 -- --  08/20/22 1146 (!) 104/48 -- -- 79 -- --  08/20/22 1009 106/60 -- -- 73 -- --  08/20/22 0919 (!) 114/56 -- -- 79 -- --  08/20/22 0840 119/69 -- -- 75 -- --  08/20/22 0801 114/74 -- -- 62 -- --  08/20/22 0731 112/79 -- -- 71 -- --     Physical Exam:  General: alert, cooperative, and appears  stated age Mood/Affect: happy Lungs: clear to auscultation, no wheezes, rales or rhonchi, symmetric air entry.  Heart: normal rate, regular rhythm, normal S1, S2, no murmurs, rubs, clicks or gallops. Breast: breasts appear normal, no suspicious masses, no skin or nipple changes or axillary nodes. Abdomen:  + bowel sounds, soft, non-tender Incision: healing well, no significant drainage, no dehiscence, no significant erythema, Honeycomb dressing  Uterine Fundus: firm, involution -1 Lochia: appropriate Skin: Warm, Dry. DVT Evaluation: No evidence of DVT seen on physical exam. Negative Homan's sign. No cords or calf tenderness. No significant calf/ankle edema.  Labs: Recent Labs    08/19/22 2305 08/20/22 2207 08/21/22 0508  HGB 9.3* 7.2* 6.4*  HCT 31.3* 23.0* 20.2*  WBC 9.4 14.6* 12.3*    CBG (last 3)  No results for input(s): "GLUCAP" in the last 72 hours.   I/O: I/O last 3 completed shifts: In: 2834.1 [P.O.:250; I.V.:1884.1; IV Piggyback:700] Out: 3297 [Urine:1900; Blood:1397]   Assessment Postpartum Postoperative Day # 1  Erling Conte, [id number], [redacted]w[redacted]d, S/P Repeat LT Cesarean Section due to pt was admitted on 3/16 for TOLAC IOL postdates 41.1 weeks, progressed to 8cm with failure to progress then fetal bradycardia, went o OR with DR Delora Fuel on 3/16 with spinal epidural, EBL was 1437 with PPH, hgb drop of 9.3-7.2-6.4, pt is asymptomatic but discussed R/B/A of blood transfusion Patient was consented for blood products.  The patient is aware that bleeding may result in the need for a blood transfusion which includes risk of transmission of HIV (1:2 million), Hepatitis C (1:2 million), and Hepatitis B (1:200 thousand) and transfusion reaction.  Patient voiced understanding of the above risks as well as understanding of indications for blood transfusion.  Pt consent 2 unit RBC will be transfused on 3/17. Had a baby female that desires circ in pt prior to discharge.   Pt stable. -1  Involution. Breast Feeding. Hemodynamically Stable.  Plan: Continue other mgmt as ordered ABLA: PO Iron, started, pt to get 2 units RBC today, repeat CBC 4 hours post last transfusion.  VTE Prophylactics: SCD, ambulated as tolerates.  Pain control: Motrin/Tylenol/Narcotics PRN Education given regarding options for contraception, including barrier methods, injectable contraception, IUD placement, oral contraceptives.  Plan for discharge tomorrow, Breastfeeding, Lactation consult, and Circumcision prior to discharge  Dr. Delora Fuel to be updated on patient status  [place], FNP-C, PMHNP-BC  [address] [place], [place] [address]  Cell:  [phone]  Office Phone: [phone] Fax: [phone] 08/21/2022  7:22 AM

## 2022-08-21 NOTE — Lactation Note (Signed)
This note was copied from a baby's chart. Lactation Consultation Note  Patient Name: Felicia Klein [id number] Date: 08/21/2022 Age:31 hours Reason for consult: Initial assessment;Breastfeeding assistance;Term  [facility] Student went into birthing parents room for initial lactation consult. Birthing parent shared baby last fed at 8:55 am for 15 minutes on both breasts, reporting harder to latch on left and easier to latch on right. States concerns over breast milk production and volume, and asked about what to do if her breast milk is not enough. Answered questions and gave reassurance on infant stomach size, milk production expectations, information on options, and offered to assist with latch. Discussed baby may be spitty due to being born via c-section and possible delay in mature milk transferring in due to significance of blood loss. Baby unable to wake up to latch and was not cuing to feed, took baby's swaddled blanket off and assisted birthing parent in removing baby's one sie. Mom offered right breast in football hold and baby would not wake up. Discussed stimulation recommendations and encouraged hand expression and or pumping. Taught hand expression and set birthing parent up with pump with 1 mL output from right breast in 15 minutes using size 30mm flange, while baby did skin to skin and rested on left breast. Discussed milk storage guidelines, recommendations on frequency, importance of support, hydration, nourishment, and rest. Taught how to assemble, use and clean double pump kit and Symphony, and reviewed flange fit. Reminded of output expectations for baby, and shared about lactation support options such as out patient [facility] appointments, [facility] phone number to call for LC support, [facility], [employer], as well as virtual and in person clinic options at the [facility] lactation clinic.   Feeding Plan: Offer the breast on cue 8+x in 24 hours skin to skin. Hand express and or express with DEBP as  needed, (if baby is too sleepy to wake up for a feeding or if needed for supplementation). Discuss questions or concerns with baby's medical provider- if needing to supplement with anything other than birth parents breast milk. Call RN or [place] with questions or concerns and or if needing assistance with latching, pumping or hand expression.    Maternal Data Has patient been taught Hand Expression?: Yes Does the patient have breastfeeding experience prior to this delivery?: Yes How long did the patient breastfeed?: Birth parent shared breastfeeding and pumping experience with 2 older children, about 5 months with each.  Feeding Mother's Current Feeding Choice: Breast Milk  LATCH Score Latch: Grasps breast easily, tongue down, lips flanged, rhythmical sucking.  Audible Swallowing: A few with stimulation  Type of Nipple: Everted at rest and after stimulation  Comfort (Breast/Nipple): Soft / non-tender  Hold (Positioning): Assistance needed to correctly position infant at breast and maintain latch.  LATCH Score: 8   Lactation Tools Discussed/Used Tools: Pump Breast pump type: Double-Electric Breast Pump (Birthing parent shared has Motif wireless DEBP at home that is new from insurance with hands free pumping bra.) Pump Education: Setup, frequency, and cleaning;Milk Storage Reason for Pumping: Stimulation Pumping frequency: 1 Pumped volume: 1 mL (12mL total after 15 minutes on right breast)  Interventions    Discharge Pump: Hands Free;DEBP;Personal  Consult Status Consult Status: Follow-up Date: 08/22/22 Follow-up type: In-patient    Debara Pickett, BSW, Lactation Student  08/21/2022, 12:45 PM

## 2022-08-22 ENCOUNTER — Encounter (HOSPITAL_COMMUNITY): Payer: Self-pay | Admitting: Obstetrics and Gynecology

## 2022-08-22 LAB — POCT I-STAT EG7
Acid-base deficit: 4 mmol/L — ABNORMAL HIGH (ref 0.0–2.0)
Bicarbonate: 22 mmol/L (ref 20.0–28.0)
Calcium, Ion: 1.15 mmol/L (ref 1.15–1.40)
HCT: 24 % — ABNORMAL LOW (ref 36.0–46.0)
Hemoglobin: 8.2 g/dL — ABNORMAL LOW (ref 12.0–15.0)
O2 Saturation: 59 %
Potassium: 3.7 mmol/L (ref 3.5–5.1)
Sodium: 136 mmol/L (ref 135–145)
TCO2: 23 mmol/L (ref 22–32)
pCO2, Ven: 43.4 mmHg — ABNORMAL LOW (ref 44–60)
pH, Ven: 7.312 (ref 7.25–7.43)
pO2, Ven: 33 mmHg (ref 32–45)

## 2022-08-22 LAB — TYPE AND SCREEN
ABO/RH(D): A POS
Antibody Screen: NEGATIVE
Unit division: 0
Unit division: 0

## 2022-08-22 LAB — BPAM RBC
Blood Product Expiration Date: 202404082359
Blood Product Expiration Date: 202404092359
ISSUE DATE / TIME: 202403171043
ISSUE DATE / TIME: 202403171302
Unit Type and Rh: 6200
Unit Type and Rh: 6200

## 2022-08-22 MED ORDER — OXYCODONE HCL 5 MG PO TABS: 5.0000 mg | ORAL_TABLET | Freq: Four times a day (QID) | ORAL | 0 refills | Status: AC | PRN

## 2022-08-22 MED ORDER — ACETAMINOPHEN 500 MG PO TABS: 1000.0000 mg | ORAL_TABLET | Freq: Four times a day (QID) | ORAL | 0 refills | Status: AC

## 2022-08-22 MED ORDER — IBUPROFEN 600 MG PO TABS: 600.0000 mg | ORAL_TABLET | Freq: Four times a day (QID) | ORAL | 0 refills | Status: AC

## 2022-08-22 NOTE — Discharge Summary (Signed)
Postpartum Discharge Summary  Date of Service updated 08/22/22    Patient Name: Felicia Klein DOB: [id number] MRN: [id number]  Date of admission: 08/19/2022 Delivery date:08/20/2022  Delivering provider: Drema Dallas  Date of discharge: 08/22/2022  Admitting diagnosis: Post term pregnancy [O48.0] Intrauterine pregnancy: [redacted]w[redacted]d     Secondary diagnosis:  Principal Problem:   Post term pregnancy Active Problems:   Hx of cesarean section   S/P cesarean section   Normal postpartum course   Acute blood loss anemia  Additional problems: none    Discharge diagnosis: Term Pregnancy Delivered and Anemia                                              Post partum procedures:blood transfusion Augmentation: AROM and Pitocin Complications: [id number]  Hospital course: Induction of Labor With Cesarean Section   32 y.o. yo [phone] at [redacted]w[redacted]d was admitted to the hospital 08/19/2022 for induction of labor for post dates Pt was a previous cesarean section for arrest of dilation with an OP presentation. Patient desired a trial of labor. Pt had a labor course significant for arrest of dilation. The patient went for cesarean section due to Arrest of Dilation. Delivery details are as follows: Membrane Rupture Time/Date: 6:11 AM ,08/20/2022   Delivery Method:C-Section, Low Transverse  Details of operation can be found in separate operative Note.  Patient had a postpartum course complicated by symptomatic anemia resulting acute blood loss during surgery. Hemoglobin increased appropriately after 2 units of packed red blood cells. Symptoms of anemia resolved on post-op day 2 and pt is stable for discharge home.  She is ambulating, tolerating a regular diet, passing flatus, and urinating well.  Patient is discharged home in stable condition on 08/22/22.      Newborn Data: Birth date:08/20/2022  Birth time:5:55 PM  Gender:Female  Living status:Living  Apgars:9 ,9  Weight:4300 g                                 Magnesium Sulfate received: No BMZ received: No Rhophylac:N/A MMR:N/A Transfusion:Yes 2 units PRBC  Physical exam  Vitals:   08/21/22 1323 08/21/22 1512 08/21/22 2010 08/22/22 0641  BP: (!) 115/59 119/66 121/67 114/65  Pulse: 75 84 77 84  Resp: 17 18 18 17   Temp: 98 F (36.7 C) 98.3 F (36.8 C) 98.4 F (36.9 C) 98.3 F (36.8 C)  TempSrc: Oral Oral Oral Oral  SpO2: 98% 99% 100% 100%  Weight:      Height:       General: alert, cooperative, and no distress Lochia: appropriate Uterine Fundus: firm Incision: well-approximated, dressing >50% saturated with serous fluid. Honeycomb dressing replaced prior to discharge and to be removed in 5 days DVT Evaluation: No evidence of DVT seen on physical exam. No cords or calf tenderness. No significant calf/ankle edema. Labs: Lab Results  Component Value Date   WBC 12.5 (H) 08/21/2022   HGB 8.7 (L) 08/21/2022   HCT 26.9 (L) 08/21/2022   MCV 82.3 08/21/2022   PLT 189 08/21/2022      Latest Ref Rng & Units 12/24/2013    8:00 AM  CMP  Glucose 70 - 99 mg/dL 98   BUN 6 - 23 mg/dL 12   Creatinine 0.50 - 1.10 mg/dL 0.72   Sodium 137 -  147 mEq/L 136   Potassium 3.7 - 5.3 mEq/L 4.1   Chloride 96 - 112 mEq/L 99   CO2 19 - 32 mEq/L 25   Calcium 8.4 - 10.5 mg/dL 9.5   Total Protein 6.0 - 8.3 g/dL 6.9   Total Bilirubin 0.3 - 1.2 mg/dL 0.3   Alkaline Phos 39 - 117 U/L 121   AST 0 - 37 U/L 19   ALT 0 - 35 U/L 8    Edinburgh Score:    08/21/2022    8:30 AM  Edinburgh Postnatal Depression Scale Screening Tool  I have been able to laugh and see the funny side of things. 0  I have looked forward with enjoyment to things. 0  I have blamed myself unnecessarily when things went wrong. 2  I have been anxious or worried for no good reason. 1  I have felt scared or panicky for no good reason. 0  Things have been getting on top of me. 0  I have been so unhappy that I have had difficulty sleeping. 0  I have felt sad or miserable. 0  I  have been so unhappy that I have been crying. 0  The thought of harming myself has occurred to me. 0  Edinburgh Postnatal Depression Scale Total 3      After visit meds:  Allergies as of 08/22/2022   No Known Allergies      Medication List     TAKE these medications    acetaminophen 500 MG tablet Commonly known as: TYLENOL Take 2 tablets (1,000 mg total) by mouth every 6 (six) hours.   Ferrous Fumarate 324 (106 Fe) MG Tabs tablet Commonly known as: HEMOCYTE - 106 mg FE Take 1 tablet (106 mg of iron total) by mouth 2 (two) times daily.   ibuprofen 600 MG tablet Commonly known as: ADVIL Take 1 tablet (600 mg total) by mouth every 6 (six) hours. What changed:  when to take this reasons to take this   oxyCODONE 5 MG immediate release tablet Commonly known as: Oxy IR/ROXICODONE Take 1 tablet (5 mg total) by mouth every 6 (six) hours as needed for up to 5 days for moderate pain.   prenatal multivitamin Tabs tablet Take 1 tablet by mouth daily at 12 noon.               Discharge Care Instructions  (From admission, onward)           Start     Ordered   08/22/22 0000  Discharge wound care:       Comments: Take dressing off on Friday, 08/26/22, remove it sooner if it is dirty or damaged. Clean area with soap and water and pat dry. You can leave the steri strips on until they fall off or take them off gently by Friday, 08/26/22. Call the office for increased drainage, redness, pain, or warmth. Keep the incision area clean and dry at all times.   08/22/22 0915   08/22/22 0000  Change dressing       Comments: Please change honeycomb dressing before discharge. Thank you   08/22/22 0915             Discharge home in stable condition Infant Feeding: Breast Infant Disposition:home with mother Discharge instruction: per After Visit Summary and Postpartum booklet. Activity: Advance as tolerated. Pelvic rest for 6 weeks.  Diet: routine diet Anticipated Birth  Control: Unsure and information provided Postpartum Appointment:6 weeks Additional Postpartum F/U:  none Future Appointments:No  future appointments. Follow up Visit:  [facility] Follow up in 6 week(s).   Specialty: Obstetrics and Gynecology Contact information: [address] [place] [phone]                    08/22/2022 Arrie Eastern, CNM

## 2022-08-22 NOTE — Lactation Note (Signed)
This note was copied from a baby's chart. Lactation Consultation Note  Patient Name: Boy Shacole Laycock [id number] Date: 08/22/2022 Age:31 hours  Reason for consult: Follow-up assessment;Term  P3, 41.1 GA, 5% weight loss  This is mother's third baby and she has experience with breastfeeding. She states baby is latching well and baby has exclusively breast fed since birth. She has a breast pump for home use. Discussed feeding 8-12 times/24 hours.Mom made aware of O/P services, breastfeeding support groups, community resources, and our phone # for post-discharge questions.    Maternal Data Does the patient have breastfeeding experience prior to this delivery?: Yes  Feeding Mother's Current Feeding Choice: Breast Milk   Discharge Discharge Education: Engorgement and breast care;Warning signs for feeding baby Pump: DEBP;Hands Free;Personal  Consult Status Consult Status: Complete Date: 08/22/22    Gwenevere Abbot 08/22/2022, 10:35 AM

## 2022-08-23 LAB — SURGICAL PATHOLOGY

## 2022-09-01 ENCOUNTER — Telehealth (HOSPITAL_COMMUNITY): Payer: Self-pay | Admitting: *Deleted

## 2022-09-01 NOTE — Telephone Encounter (Signed)
Attempted Hospital Discharge Follow-Up Call.  Left voice mail requesting that patient return RN's phone call if patient has any concerns or questions.
